# Patient Record
Sex: Male | Born: 1970 | Race: White | Hispanic: No | Marital: Married | Smoking: Former smoker
Health system: Southern US, Community
[De-identification: ages and names within clinical notes are randomized; demographics above are authoritative.]

## PROBLEM LIST (undated history)

## (undated) DIAGNOSIS — F439 Reaction to severe stress, unspecified: Secondary | ICD-10-CM

## (undated) DIAGNOSIS — K219 Gastro-esophageal reflux disease without esophagitis: Secondary | ICD-10-CM

## (undated) DIAGNOSIS — J45909 Unspecified asthma, uncomplicated: Secondary | ICD-10-CM

## (undated) DIAGNOSIS — F419 Anxiety disorder, unspecified: Secondary | ICD-10-CM

## (undated) DIAGNOSIS — E785 Hyperlipidemia, unspecified: Secondary | ICD-10-CM

## (undated) DIAGNOSIS — T781XXA Other adverse food reactions, not elsewhere classified, initial encounter: Secondary | ICD-10-CM

## (undated) HISTORY — PX: NO PAST SURGERIES: SHX2092

## (undated) HISTORY — DX: Hyperlipidemia, unspecified: E78.5

## (undated) HISTORY — DX: Unspecified asthma, uncomplicated: J45.909

## (undated) HISTORY — DX: Reaction to severe stress, unspecified: F43.9

---

## 1999-11-13 ENCOUNTER — Ambulatory Visit (HOSPITAL_COMMUNITY): Admission: RE | Admit: 1999-11-13 | Discharge: 1999-11-13 | Payer: Self-pay | Admitting: Orthopedic Surgery

## 1999-11-13 ENCOUNTER — Encounter: Payer: Self-pay | Admitting: Orthopedic Surgery

## 1999-11-27 ENCOUNTER — Encounter: Payer: Self-pay | Admitting: Orthopedic Surgery

## 1999-11-27 ENCOUNTER — Ambulatory Visit: Admission: RE | Admit: 1999-11-27 | Discharge: 1999-11-27 | Payer: Self-pay | Admitting: Orthopedic Surgery

## 2002-05-11 ENCOUNTER — Encounter: Admission: RE | Admit: 2002-05-11 | Discharge: 2002-05-11 | Payer: Self-pay | Admitting: Family Medicine

## 2002-05-11 ENCOUNTER — Encounter: Payer: Self-pay | Admitting: Family Medicine

## 2003-12-11 ENCOUNTER — Emergency Department (HOSPITAL_COMMUNITY): Admission: EM | Admit: 2003-12-11 | Discharge: 2003-12-11 | Payer: Self-pay | Admitting: Emergency Medicine

## 2004-03-27 ENCOUNTER — Emergency Department (HOSPITAL_COMMUNITY): Admission: EM | Admit: 2004-03-27 | Discharge: 2004-03-27 | Payer: Self-pay | Admitting: Emergency Medicine

## 2004-05-03 ENCOUNTER — Emergency Department (HOSPITAL_COMMUNITY): Admission: EM | Admit: 2004-05-03 | Discharge: 2004-05-04 | Payer: Self-pay | Admitting: Emergency Medicine

## 2008-01-20 ENCOUNTER — Encounter
Admission: RE | Admit: 2008-01-20 | Discharge: 2008-01-20 | Payer: Self-pay | Admitting: Physical Medicine and Rehabilitation

## 2013-03-04 ENCOUNTER — Other Ambulatory Visit: Payer: Self-pay | Admitting: Otolaryngology

## 2013-03-04 DIAGNOSIS — K219 Gastro-esophageal reflux disease without esophagitis: Secondary | ICD-10-CM

## 2013-03-04 DIAGNOSIS — R1310 Dysphagia, unspecified: Secondary | ICD-10-CM

## 2013-03-08 ENCOUNTER — Ambulatory Visit
Admission: RE | Admit: 2013-03-08 | Discharge: 2013-03-08 | Disposition: A | Discharge status: Home / Self Care | Payer: BC Managed Care – PPO | Source: Ambulatory Visit | Attending: Family Medicine | Admitting: Family Medicine

## 2013-03-08 ENCOUNTER — Other Ambulatory Visit: Payer: Self-pay | Admitting: Family Medicine

## 2013-03-08 DIAGNOSIS — R079 Chest pain, unspecified: Secondary | ICD-10-CM

## 2013-03-11 ENCOUNTER — Ambulatory Visit
Admission: RE | Admit: 2013-03-11 | Discharge: 2013-03-11 | Disposition: A | Discharge status: Home / Self Care | Payer: BC Managed Care – PPO | Source: Ambulatory Visit | Attending: Otolaryngology | Admitting: Otolaryngology

## 2013-03-11 DIAGNOSIS — K219 Gastro-esophageal reflux disease without esophagitis: Secondary | ICD-10-CM

## 2013-03-11 DIAGNOSIS — R1310 Dysphagia, unspecified: Secondary | ICD-10-CM

## 2013-03-12 ENCOUNTER — Other Ambulatory Visit: Payer: Self-pay | Admitting: Otolaryngology

## 2015-10-03 ENCOUNTER — Other Ambulatory Visit: Payer: Self-pay | Admitting: Family Medicine

## 2015-10-03 ENCOUNTER — Ambulatory Visit
Admission: RE | Admit: 2015-10-03 | Discharge: 2015-10-03 | Disposition: A | Discharge status: Home / Self Care | Payer: BLUE CROSS/BLUE SHIELD | Source: Ambulatory Visit | Attending: Family Medicine | Admitting: Family Medicine

## 2015-10-03 DIAGNOSIS — T1490XA Injury, unspecified, initial encounter: Secondary | ICD-10-CM

## 2016-01-17 ENCOUNTER — Ambulatory Visit (INDEPENDENT_AMBULATORY_CARE_PROVIDER_SITE_OTHER): Payer: BLUE CROSS/BLUE SHIELD | Admitting: Allergy & Immunology

## 2016-01-17 ENCOUNTER — Encounter (INDEPENDENT_AMBULATORY_CARE_PROVIDER_SITE_OTHER): Payer: Self-pay

## 2016-01-17 ENCOUNTER — Encounter: Payer: Self-pay | Admitting: Allergy & Immunology

## 2016-01-17 VITALS — BP 114/78 | HR 72 | Temp 98.3°F | Ht 73.0 in | Wt 229.3 lb

## 2016-01-17 DIAGNOSIS — J4520 Mild intermittent asthma, uncomplicated: Secondary | ICD-10-CM

## 2016-01-17 DIAGNOSIS — T781XXA Other adverse food reactions, not elsewhere classified, initial encounter: Secondary | ICD-10-CM

## 2016-01-17 DIAGNOSIS — J310 Chronic rhinitis: Secondary | ICD-10-CM | POA: Diagnosis not present

## 2016-01-17 MED ORDER — EPINEPHRINE 0.3 MG/0.3ML IJ SOAJ: INTRAMUSCULAR | 2 refills | Status: AC

## 2016-01-17 NOTE — Addendum Note (Signed)
Addended by: Bennye AlmMIRANDA, Arneda Sappington on: 01/17/2016 03:18 PM   Modules accepted: Orders

## 2016-01-17 NOTE — Progress Notes (Signed)
NEW PATIENT  Date of Service/Encounter:  01/17/16   Assessment:   Adverse food reaction, initial encounter - Plan: Allergy Test, Alpha-Gal Panel, Tryptase  Chronic rhinitis  Mild intermittent asthma, uncomplicated   Asthma Reportables:  Severity: : intermittent  Risk: low Control: well controlled  Seasonal Influenza Vaccine: no but encouraged    Plan/Recommendations:     1. Adverse food reaction, initial encounter - Testing was negative for pork, beef, and lamb. - We will send blood work to confirm. Often the skin testing is negative and the blood testing is positive. - Although he has a past history of allergies to wheat and corn, he is tolerating these without any problems. He was told to avoid these because of cross reactivity with certain pollens, however he never actually had clinical reactions to these foods. - We send in a prescription for AuviQ epinephrine auto injector because there is currently no co-pay.  - The patient's reaction was severe enough to warrant having an epinephrine auto injector.  - Teaching for [place] provided.  2. Chronic rhinitis - Continue with loratadine and eye drops as needed. - Consider doing allergy shots in the future.  - The patient did have allergy shots in the remote past, but there is possibility that they might not have been mixing the injection strong enough.  - The patient is not interested in allergy shots again at this time.  3. Intermittent asthma -  There is no indication for a controller at this time. - Offered script for albuterol but the patient declined.  - Spirometry normal today. There is no need for future spiros.   4. Return to the clinic in six months.         Subjective:   Steven Santos is a 45 y.o. male presenting today for evaluation of  Chief Complaint  Patient presents with  . Allergy Testing    alpha gal testing; hives, diarrhea, vomiting, hands and arms numbness, pain happens at night  onset 10/2015; happened 3-4 times  .  Steven Santos has a history of the following: There are no active problems to display for this patient.   History obtained from: chart review and patient.  Steven Santos was referred by Simona Huh, MD.     Steven Santos is a 45 y.o. male presenting with concern for alpha-gal sensitivity. Steven Santos reports that the symptoms started in May 2017. Around that time, he started developing hives, diarrhea, vomiting, as well as hand and arm numbness. He reports significant abdominal pain. These symptoms are always in the middle of the night. He has missed one entire week of work in July on three occasions. He has even passed out after this in the middle of the night. When he comes to, he will take 2-3 Benadryl since he thinks that this is related to an allergic reaction. He has tick bites multiple times per year. 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 Symptoms are always after red meant (hamburger, steak, BBQ pork). He has been avoiding the red meat and the pork since the middle of July. He has not had any episodes.   Saba does have a history of seasonal allergies. He has been worked up at [facility] in the past (15 years ago). He was told to avoid green beans, corn, and wheat. He has not been avoiding all of those foods. He never had any anaphylactic episodes from this. He did start injections for around one year but did not notice any  improvement in his symptoms, therefore he stopped. This was around 15 years ago. Since that time, he uses loratadine as needed as well as over-the-counter eyedrops. These seem to control his symptoms well.   Asthma/Respiratory Symptom History: Rescue medication: does not have albuterol Controller(s): none Triggers:  cold air, exercise and pollens  Average frequency of daytime symptoms: rare Average frequency of nocturnal symptoms: rare  Average frequency of exercise symptoms: rare Hospitalizations for respiratory symptoms  since last visit (self report): 0 ACT or TRACK score: 25 ED or Urgent Care visits for respiratory symptoms since last visit (self report): 0 Oral/systemic corticosteroids for respiratory symptoms since last visit:0  Allergic Rhinitis Symptom History:  Symptoms: nasal congestion, rhinorrhea, sneezing, eye itching and watery eyes Times of year: summer, fall and spring Treatments tried: oral antihistamines: loratdine 10mg , eye drops:  OTC eye drops Allergy tested in the past: Yes  On IT in the past: Yes (one year)  Otherwise, there is no history of other atopic diseases, including drug allergies, stinging insect allergies, or urticaria. There is no significant infectious history. Vaccinations are up to date.    Past Medical History: There are no active problems to display for this patient.   Medication List:    Medication List       Accurate as of 01/17/16  2:56 PM. Always use your most recent med list.          albuterol 108 (90 Base) MCG/ACT inhaler Commonly known as:  PROVENTIL HFA;VENTOLIN HFA Inhale into the lungs every 6 (six) hours as needed for wheezing or shortness of breath.   atorvastatin 20 MG tablet Commonly known as:  LIPITOR Take 20 mg by mouth daily.   citalopram 40 MG tablet Commonly known as:  CELEXA Take by mouth.   EPINEPHrine 0.3 mg/0.3 mL Soaj injection Commonly known as:  AUVI-Q Use as directed for severe allergic reaction        Birth History: non-contributory  Developmental History: Steven Santos has met all milestones on time.   Past Surgical History: None Past Surgical History:  Procedure Laterality Date  . NO PAST SURGERIES       Family History: Family History  Problem Relation Age of Onset  . Allergic rhinitis Father   . Angioedema Neg Hx   . Asthma Neg Hx   . Eczema Neg Hx   . Immunodeficiency Neg Hx   . Urticaria Neg Hx      Social History: Regis lives at home with his 15yo son. There are two dogs At home. There is no cigarette  exposure. 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 He does spend a lot of time outdoors estimates that he has about a dozen tick bites per year.    Review of Systems: a 14-point review of systems is pertinent for what is mentioned in HPI.  Otherwise, all other systems were negative. Constitutional: negative other than that listed in the HPI Eyes: negative other than that listed in the HPI Ears, nose, mouth, throat, and face: negative other than that listed in the HPI Respiratory: negative other than that listed in the HPI Cardiovascular: negative other than that listed in the HPI Gastrointestinal: negative other than that listed in the HPI Genitourinary: negative other than that listed in the HPI Integument: negative other than that listed in the HPI Hematologic: negative other than that listed in the HPI Musculoskeletal: negative other than that listed in the HPI Neurological: negative other than that listed in the HPI Allergy/Immunologic:   negative other than that listed in the HPI    Objective:   Blood pressure 114/78, pulse 72, temperature 98.3 F (36.8 C), temperature source Oral, height 6' 1" (1.854 m), weight 229 lb 4.5 oz (104 kg). Body mass index is 30.25 kg/m.   Physical Exam:  General: Alert, interactive, in no acute distress. HEENT: TMs pearly gray, turbinates minimally edematous without discharge, post-pharynx mildly erythematous. Neck: Supple without lymphadenopathy. Lungs: Clear to auscultation without wheezing, rhonchi or rales. No crackles. No increased work of breathing.  CV: Normal S1, S2 without murmurs. Abdomen: Nondistended, nontender. Skin:Warm and dry, without lesions or rashes. Extremities: No clubbing, cyanosis or edema. Neuro: Grossly intact. No focal deficits noted  Diagnostic studies:  Spirometry: results normal (FEV1: 3.47/78%, FVC: 4.06/74%, FEV1/FVC: 95%).    Spirometry consistent with normal pattern   Allergy Studies: negative to  pork, beef, and lamb with adequate controls    , MD [facility]     

## 2016-01-17 NOTE — Patient Instructions (Signed)
1. Adverse food reaction, initial encounter - Testing was negative for pork, beef, and lamb. - We will send blood work to confirm. Often the skin testing is negative and the blood testing is positive. - We send in a prescription for AuviQ epinephrine auto injector.  - Keep the AuviQ with you at all times.   2. Chronic rhinitis - Continue with lortatadine and eye drops as needed. - Consider doing allergy shots in the future.  - The previous practice might not have been doing strong enough injections.  3. Return to the clinic in six months.   It was a pleasure to meet you today!

## 2016-01-19 LAB — TRYPTASE: Tryptase: 4.5 ug/L (ref <11)

## 2016-01-22 ENCOUNTER — Telehealth: Payer: Self-pay | Admitting: Allergy & Immunology

## 2016-01-22 LAB — ALPHA-GAL PANEL
ALLERGEN, MUTTON, F88: 2.24 kU/L — AB
ALLERGEN, PORK, F26: 2.64 kU/L — AB
Beef: 4.55 kU/L — ABNORMAL HIGH
Galactose-alpha-1,3-galactose IgE: 11 kU/L — ABNORMAL HIGH (ref <0.35)

## 2016-01-22 NOTE — Telephone Encounter (Signed)
[facility] called about [employer] [employer] and needs the doctor's approval to send a package. [phone]

## 2016-01-22 NOTE — Telephone Encounter (Signed)
Spoke with [facility] and they will mail his package to clinic and we will inform him that it is here once it arrives on Thursday.

## 2016-01-23 ENCOUNTER — Telehealth: Payer: Self-pay | Admitting: Allergy & Immunology

## 2016-01-23 NOTE — Telephone Encounter (Signed)
Patient called and said a nurse called him this morning 01/23/16. Last seen 01/17/16. He thinks it is for lab results.

## 2016-01-24 NOTE — Telephone Encounter (Signed)
I had called pt yesterday about his lab results. I have tried contacting him again this morning.

## 2016-01-24 NOTE — Telephone Encounter (Signed)
Spoke with pt informed him of his results

## 2017-08-11 ENCOUNTER — Other Ambulatory Visit: Payer: Self-pay | Admitting: Family Medicine

## 2017-08-11 ENCOUNTER — Ambulatory Visit
Admission: RE | Admit: 2017-08-11 | Discharge: 2017-08-11 | Disposition: A | Discharge status: Home / Self Care | Payer: BLUE CROSS/BLUE SHIELD | Source: Ambulatory Visit | Attending: Family Medicine | Admitting: Family Medicine

## 2017-08-11 DIAGNOSIS — R0789 Other chest pain: Secondary | ICD-10-CM

## 2017-08-21 ENCOUNTER — Other Ambulatory Visit: Payer: Self-pay

## 2017-08-21 ENCOUNTER — Encounter (HOSPITAL_COMMUNITY): Payer: Self-pay

## 2017-08-21 ENCOUNTER — Emergency Department (HOSPITAL_COMMUNITY)
Admission: EM | Admit: 2017-08-21 | Discharge: 2017-08-21 | Disposition: A | Discharge status: Home / Self Care | Payer: BLUE CROSS/BLUE SHIELD | Attending: Emergency Medicine | Admitting: Emergency Medicine

## 2017-08-21 DIAGNOSIS — Z79899 Other long term (current) drug therapy: Secondary | ICD-10-CM | POA: Diagnosis not present

## 2017-08-21 DIAGNOSIS — R55 Syncope and collapse: Secondary | ICD-10-CM | POA: Diagnosis present

## 2017-08-21 DIAGNOSIS — J45909 Unspecified asthma, uncomplicated: Secondary | ICD-10-CM | POA: Diagnosis not present

## 2017-08-21 DIAGNOSIS — Z87891 Personal history of nicotine dependence: Secondary | ICD-10-CM | POA: Insufficient documentation

## 2017-08-21 LAB — BASIC METABOLIC PANEL
Anion gap: 9 (ref 5–15)
BUN: 18 mg/dL (ref 6–20)
CO2: 25 mmol/L (ref 22–32)
Calcium: 8.8 mg/dL — ABNORMAL LOW (ref 8.9–10.3)
Chloride: 108 mmol/L (ref 101–111)
Creatinine, Ser: 1.21 mg/dL (ref 0.61–1.24)
GFR calc Af Amer: >60 mL/min (ref >60)
GFR calc non Af Amer: >60 mL/min (ref >60)
Glucose, Bld: 92 mg/dL (ref 65–99)
Potassium: 3.6 mmol/L (ref 3.5–5.1)
Sodium: 142 mmol/L (ref 135–145)

## 2017-08-21 LAB — CBC
HEMATOCRIT: 39.7 % (ref 39.0–52.0)
HEMOGLOBIN: 13.5 g/dL (ref 13.0–17.0)
MCH: 32.9 pg (ref 26.0–34.0)
MCHC: 34 g/dL (ref 30.0–36.0)
MCV: 96.8 fL (ref 78.0–100.0)
Platelets: 194 10*3/uL (ref 150–400)
RBC: 4.1 MIL/uL — AB (ref 4.22–5.81)
RDW: 12 % (ref 11.5–15.5)
WBC: 8.5 10*3/uL (ref 4.0–10.5)

## 2017-08-21 LAB — CBG MONITORING, ED: Glucose-Capillary: 99 mg/dL (ref 65–99)

## 2017-08-21 NOTE — ED Notes (Signed)
PT states understanding of care given, follow up care. PT ambulated from ED to car with a steady gait.  

## 2017-08-21 NOTE — ED Triage Notes (Signed)
Pt arrives [facility] from job where pt became weak , sweaty and almost passed out but went to truck and put on ac and was able to maintain alertness. No LOC. Denies chest pain c/o shob . Hand and arms became numb. Given 500cc NS pta.

## 2017-08-21 NOTE — Discharge Instructions (Signed)
Make sure that you drink at least six 8 ounce glasses of water or Gatorade each day in order to stay well-hydrated.  Rest tomorrow and do not do anything strenuous.  Return if your condition worsens for any reason.  Call Dr.Ehinger tomorrow to schedule the next available appointment

## 2017-08-21 NOTE — ED Provider Notes (Signed)
[facility] Provider Note   CSN: [id number] Arrival date & time: 08/21/17  1738     History   Chief Complaint Chief Complaint  Patient presents with  . Near Syncope    HPI Steven Santos is a 47 y.o. male.  Patient brought by EMS.  He reports that at 4 PM today while at work he became lightheaded and sweaty.  For several minutes.  He "both arms became "numb" for a few minutes and then he gradually felt better when he made it back to his truck and turned on the air conditioner.  He is presently asymptomatic except for mild generalized weakness.  No other complaint.  He denies any chest pain denies shortness of breath denies nausea no other associated symptoms.  He states that lightheadedness lasted for 15 or 20 minutes.  EMS treated patient with 500 mL bolus of normal saline prior to arrival.  HPI  Past Medical History:  Diagnosis Date  . Asthma   . Hyperlipidemia   . Stress     There are no active problems to display for this patient.   Past Surgical History:  Procedure Laterality Date  . NO PAST SURGERIES         Home Medications    Prior to Admission medications   Medication Sig Start Date End Date Taking? Authorizing Provider  albuterol (PROVENTIL HFA;VENTOLIN HFA) 108 (90 Base) MCG/ACT inhaler Inhale into the lungs every 6 (six) hours as needed for wheezing or shortness of breath.    [provider]  atorvastatin (LIPITOR) 20 MG tablet Take 20 mg by mouth daily. 01/05/16   [provider]  citalopram (CELEXA) 40 MG tablet Take by mouth.    [provider]  EPINEPHrine (AUVI-Q) 0.3 mg/0.3 mL IJ SOAJ injection Use as directed for severe allergic reaction 01/17/16   Alfonse SpruceGallagher, Joel Louis, MD    Family History Family History  Problem Relation Age of Onset  . Allergic rhinitis Father   . Angioedema Neg Hx   . Asthma Neg Hx   . Eczema Neg Hx   . Immunodeficiency Neg Hx   . Urticaria Neg Hx     History negative for coronary disease Social History Social History   Tobacco Use  . Smoking status: Former Games developermoker  . Smokeless tobacco: Former Engineer, waterUser  Substance Use Topics  . Alcohol use: Yes    Comment: occasional  . Drug use: No     Allergies   Patient has no known allergies.   Review of Systems Review of Systems  Constitutional: Positive for diaphoresis.  HENT: Negative.   Respiratory: Negative.   Cardiovascular: Negative.   Gastrointestinal: Negative.   Musculoskeletal: Negative.   Skin: Negative.   Neurological: Positive for light-headedness and numbness.  Psychiatric/Behavioral: Negative.   All other systems reviewed and are negative.    Physical Exam Updated Vital Signs BP 124/82 (BP Location: Right Arm)   Pulse 71   Temp 98.1 F (36.7 C) (Oral)   Resp 18   Ht 6\' 1"  (1.854 m)   Wt 102.1 kg (225 lb)   SpO2 100%   BMI 29.69 kg/m   Physical Exam  Constitutional: He appears well-developed and well-nourished.  HENT:  Head: Normocephalic and atraumatic.  Eyes: Conjunctivae are normal. Pupils are equal, round, and reactive to light.  Neck: Neck supple. No tracheal deviation present. No thyromegaly present.  Cardiovascular: Normal rate, regular rhythm, normal heart sounds and intact distal pulses. Exam reveals no friction  rub.  No murmur heard. Pulmonary/Chest: Effort normal and breath sounds normal.  Abdominal: Soft. Bowel sounds are normal. He exhibits no distension. There is no tenderness.  Musculoskeletal: Normal range of motion. He exhibits no edema or tenderness.  Neurological: He is alert. Coordination normal.  Skin: Skin is warm and dry. No rash noted.  Psychiatric: He has a normal mood and affect.  Nursing note and vitals reviewed.    ED Treatments / Results  Labs (all labs ordered are listed, but only abnormal results are displayed) Labs Reviewed  BASIC METABOLIC PANEL  CBC  CBG MONITORING, ED    EKG  EKG  Interpretation  Date/Time:  Thursday August 21 2017 17:45:23 EDT Ventricular Rate:  67 PR Interval:    QRS Duration: 97 QT Interval:  396 QTC Calculation: 418 R Axis:   92 Text Interpretation:  Sinus rhythm Borderline right axis deviation Borderline T abnormalities, anterior leads No significant change since last tracing Confirmed by Doug Sou [phone]) on 08/21/2017 5:50:01 PM       Radiology No results found.  Procedures Procedures (including critical care time)  Medications Ordered in ED Medications - No data to display  Results for orders placed or performed during the hospital encounter of 08/21/17  Basic metabolic panel  Result Value Ref Range   Sodium 142 135 - 145 mmol/L   Potassium 3.6 3.5 - 5.1 mmol/L   Chloride 108 101 - 111 mmol/L   CO2 25 22 - 32 mmol/L   Glucose, Bld 92 65 - 99 mg/dL   BUN 18 6 - 20 mg/dL   Creatinine, Ser 6.04 0.61 - 1.24 mg/dL   Calcium 8.8 (L) 8.9 - 10.3 mg/dL   GFR calc non Af Amer >60 >60 mL/min   GFR calc Af Amer >60 >60 mL/min   Anion gap 9 5 - 15  CBC  Result Value Ref Range   WBC 8.5 4.0 - 10.5 K/uL   RBC 4.10 (L) 4.22 - 5.81 MIL/uL   Hemoglobin 13.5 13.0 - 17.0 g/dL   HCT 54.0 98.1 - 19.1 %   MCV 96.8 78.0 - 100.0 fL   MCH 32.9 26.0 - 34.0 pg   MCHC 34.0 30.0 - 36.0 g/dL   RDW 47.8 29.5 - 62.1 %   Platelets 194 150 - 400 K/uL  CBG monitoring, ED  Result Value Ref Range   Glucose-Capillary 99 65 - 99 mg/dL   Comment 1 Notify RN    Comment 2 Document in Chart    Dg Ribs Unilateral W/chest Left  Result Date: 08/11/2017 CLINICAL DATA:  Larey Seat off porch 2 weeks ago injuring the left side with pain EXAM: LEFT RIBS AND CHEST - 3+ VIEW COMPARISON:  Chest x-ray and left rib detail of 03/08/2013 FINDINGS: No active infiltrate or effusion is seen. Mediastinal and hilar contours are unremarkable. The heart is within normal limits in size. Left rib detail films were obtained with a small metallic marker over the area of pain. No left  rib fracture is seen. IMPRESSION: 1. No active lung disease. 2. Negative left rib detail. Electronically Signed   By: Dwyane Dee M.D.   On: 08/11/2017 12:17   Initial Impression / Assessment and Plan / ED Course  I have reviewed the triage vital signs and the nursing notes.  Pertinent labs & imaging results that were available during my care of the patient were reviewed by me and considered in my medical decision making (see chart for details).  7:20 PM patient is alert ambulatory feels well and ready for discharge. Plan follow-up with Dr.Ehinger outpatient Encourage oral hydration Final Clinical Impressions(s) / ED Diagnoses  Diagnoses near syncope  Final diagnoses:  None    ED Discharge Orders    None       Doug Sou, MD 08/21/17 1932

## 2018-06-09 ENCOUNTER — Other Ambulatory Visit: Payer: Self-pay | Admitting: Rheumatology

## 2018-06-09 DIAGNOSIS — M545 Low back pain, unspecified: Secondary | ICD-10-CM

## 2018-06-09 DIAGNOSIS — Z1589 Genetic susceptibility to other disease: Secondary | ICD-10-CM

## 2018-06-16 ENCOUNTER — Other Ambulatory Visit: Payer: BLUE CROSS/BLUE SHIELD

## 2018-06-26 ENCOUNTER — Ambulatory Visit
Admission: RE | Admit: 2018-06-26 | Discharge: 2018-06-26 | Disposition: A | Discharge status: Home / Self Care | Payer: BLUE CROSS/BLUE SHIELD | Source: Ambulatory Visit | Attending: Rheumatology | Admitting: Rheumatology

## 2018-06-26 DIAGNOSIS — M545 Low back pain, unspecified: Secondary | ICD-10-CM

## 2018-06-26 DIAGNOSIS — Z1589 Genetic susceptibility to other disease: Secondary | ICD-10-CM

## 2018-12-04 ENCOUNTER — Ambulatory Visit: Payer: Self-pay | Admitting: Orthopedic Surgery

## 2019-01-08 HISTORY — PX: BACK SURGERY: SHX140

## 2019-04-12 ENCOUNTER — Other Ambulatory Visit: Payer: Self-pay | Admitting: Orthopedic Surgery

## 2019-04-15 ENCOUNTER — Other Ambulatory Visit: Payer: Self-pay | Admitting: Orthopedic Surgery

## 2019-04-15 DIAGNOSIS — M9904 Segmental and somatic dysfunction of sacral region: Secondary | ICD-10-CM

## 2019-04-20 ENCOUNTER — Other Ambulatory Visit: Payer: Self-pay | Admitting: Orthopedic Surgery

## 2019-04-21 ENCOUNTER — Other Ambulatory Visit: Payer: Self-pay | Admitting: Orthopedic Surgery

## 2019-04-23 ENCOUNTER — Ambulatory Visit
Admission: RE | Admit: 2019-04-23 | Discharge: 2019-04-23 | Disposition: A | Discharge status: Home / Self Care | Payer: BC Managed Care – PPO | Source: Ambulatory Visit | Attending: Orthopedic Surgery | Admitting: Orthopedic Surgery

## 2019-04-23 ENCOUNTER — Other Ambulatory Visit: Payer: Self-pay

## 2019-04-23 DIAGNOSIS — M9904 Segmental and somatic dysfunction of sacral region: Secondary | ICD-10-CM

## 2019-05-04 ENCOUNTER — Other Ambulatory Visit: Payer: Self-pay | Admitting: Orthopedic Surgery

## 2019-05-04 DIAGNOSIS — M259 Joint disorder, unspecified: Secondary | ICD-10-CM

## 2019-05-18 ENCOUNTER — Other Ambulatory Visit: Payer: BC Managed Care – PPO

## 2019-05-18 ENCOUNTER — Inpatient Hospital Stay: Admission: RE | Admit: 2019-05-18 | Payer: BC Managed Care – PPO | Source: Ambulatory Visit

## 2019-05-25 ENCOUNTER — Ambulatory Visit
Admission: RE | Admit: 2019-05-25 | Discharge: 2019-05-25 | Disposition: A | Discharge status: Home / Self Care | Payer: BC Managed Care – PPO | Source: Ambulatory Visit | Attending: Orthopedic Surgery | Admitting: Orthopedic Surgery

## 2019-05-25 ENCOUNTER — Other Ambulatory Visit: Payer: Self-pay

## 2019-05-25 DIAGNOSIS — M259 Joint disorder, unspecified: Secondary | ICD-10-CM

## 2019-06-07 ENCOUNTER — Ambulatory Visit: Payer: Self-pay | Admitting: Orthopedic Surgery

## 2019-06-07 NOTE — Pre-Procedure Instructions (Addendum)
Your procedure is scheduled on Wednesday, December 30th, from 1:48 PM to 2:48 PM.  Report to [facility] Main Entrance "A" at 11:45 AM, and check in at the Admitting office.  Call this number if you have problems the morning of surgery:  [phone].  Call [phone] if you have any questions prior to your surgery date Monday-Friday 8am-4pm.    Remember:  Do not eat or drink after midnight the night before your surgery.    Take these medicines the morning of surgery with A SIP OF WATER : gemfibrozil (LOPID)  IF NEEDED: cetirizine (ZYRTEC) albuterol (PROVENTIL HFA;VENTOLIN HFA) inhaler. Please bring all inhalers with you the day of surgery.   As of today, STOP taking any Aspirin (unless otherwise instructed by your surgeon), naproxen sodium (ALEVE), Ibuprofen, Motrin, Advil, Goody's, BC's, all herbal medications, fish oil, and all vitamins.    The Morning of Surgery  Do not wear jewelry.  Do not wear lotions, powders, colognes, or deodorant  Men may shave face and neck.  Do not bring valuables to the hospital.  [facility] is not responsible for any belongings or valuables.  If you are a smoker, DO NOT Smoke 24 hours prior to surgery  If you wear a CPAP at night please bring your mask, tubing, and machine the morning of surgery   Remember that you must have someone to transport you home after your surgery, and remain with you for 24 hours if you are discharged the same day.   Please bring cases for contacts, glasses, hearing aids, dentures or bridgework because it cannot be worn into surgery.    Leave your suitcase in the car.  After surgery it may be brought to your room.  For patients admitted to the hospital, discharge time will be determined by your treatment team.  Patients discharged the day of surgery will not be allowed to drive home.    Special instructions:   [place]- Preparing For Surgery  Before surgery, you can play an important role. Because  skin is not sterile, your skin needs to be as free of germs as possible. You can reduce the number of germs on your skin by washing with CHG (chlorahexidine gluconate) Soap before surgery.  CHG is an antiseptic cleaner which kills germs and bonds with the skin to continue killing germs even after washing.    Please do not use if you have an allergy to CHG or antibacterial soaps. If your skin becomes reddened/irritated stop using the CHG.  Do not shave (including legs and underarms) for at least 48 hours prior to first CHG shower. It is OK to shave your face.  Please follow these instructions carefully.   1. Shower the NIGHT BEFORE SURGERY and the MORNING OF SURGERY with CHG Soap.   2. If you chose to wash your hair, wash your hair first as usual with your normal shampoo.  3. After you shampoo, rinse your hair and body thoroughly to remove the shampoo.  4. Use CHG as you would any other liquid soap. You can apply CHG directly to the skin and wash gently with a scrungie or a clean washcloth.   5. Apply the CHG Soap to your body ONLY FROM THE NECK DOWN.  Do not use on open wounds or open sores. Avoid contact with your eyes, ears, mouth and genitals (private parts). Wash Face and genitals (private parts)  with your normal soap.   6. Wash thoroughly, paying special attention to the area where  your surgery will be performed.  7. Thoroughly rinse your body with warm water from the neck down.  8. DO NOT shower/wash with your normal soap after using and rinsing off the CHG Soap.  9. Pat yourself dry with a CLEAN TOWEL.  10. Wear CLEAN PAJAMAS to bed the night before surgery, wear comfortable clothes the morning of surgery  11. Place CLEAN SHEETS on your bed the night of your first shower and DO NOT SLEEP WITH PETS.    Day of Surgery:   Remember to brush your teeth WITH YOUR REGULAR TOOTHPASTE. Please shower the morning of surgery with the CHG soap Do not apply any  deodorants/lotions. Please wear clean clothes to the hospital/surgery center.      Please read over the following fact sheets that you were given.

## 2019-06-08 ENCOUNTER — Encounter (HOSPITAL_COMMUNITY): Payer: Self-pay

## 2019-06-08 ENCOUNTER — Other Ambulatory Visit: Payer: Self-pay

## 2019-06-08 ENCOUNTER — Ambulatory Visit (HOSPITAL_COMMUNITY)
Admission: RE | Admit: 2019-06-08 | Discharge: 2019-06-08 | Disposition: A | Discharge status: Home / Self Care | Payer: BC Managed Care – PPO | Source: Ambulatory Visit | Attending: Orthopedic Surgery | Admitting: Orthopedic Surgery

## 2019-06-08 ENCOUNTER — Other Ambulatory Visit (HOSPITAL_COMMUNITY)
Admission: RE | Admit: 2019-06-08 | Discharge: 2019-06-08 | Disposition: A | Discharge status: Home / Self Care | Payer: BC Managed Care – PPO | Source: Ambulatory Visit | Attending: Orthopedic Surgery | Admitting: Orthopedic Surgery

## 2019-06-08 ENCOUNTER — Ambulatory Visit: Payer: Self-pay | Admitting: Orthopedic Surgery

## 2019-06-08 ENCOUNTER — Encounter (HOSPITAL_COMMUNITY)
Admission: RE | Admit: 2019-06-08 | Discharge: 2019-06-08 | Disposition: A | Discharge status: Home / Self Care | Payer: BC Managed Care – PPO | Source: Ambulatory Visit | Attending: Orthopedic Surgery | Admitting: Orthopedic Surgery

## 2019-06-08 DIAGNOSIS — Z01818 Encounter for other preprocedural examination: Secondary | ICD-10-CM | POA: Insufficient documentation

## 2019-06-08 HISTORY — DX: Gastro-esophageal reflux disease without esophagitis: K21.9

## 2019-06-08 HISTORY — DX: Anxiety disorder, unspecified: F41.9

## 2019-06-08 LAB — BASIC METABOLIC PANEL
Anion gap: 13 (ref 5–15)
BUN: 20 mg/dL (ref 6–20)
CO2: 25 mmol/L (ref 22–32)
Calcium: 9.7 mg/dL (ref 8.9–10.3)
Chloride: 104 mmol/L (ref 98–111)
Creatinine, Ser: 1.21 mg/dL (ref 0.61–1.24)
GFR calc Af Amer: >60 mL/min (ref >60)
GFR calc non Af Amer: >60 mL/min (ref >60)
Glucose, Bld: 106 mg/dL — ABNORMAL HIGH (ref 70–99)
Potassium: 4.1 mmol/L (ref 3.5–5.1)
Sodium: 142 mmol/L (ref 135–145)

## 2019-06-08 LAB — URINALYSIS, ROUTINE W REFLEX MICROSCOPIC
Bilirubin Urine: NEGATIVE
Glucose, UA: NEGATIVE mg/dL
Hgb urine dipstick: NEGATIVE
Ketones, ur: NEGATIVE mg/dL
Leukocytes,Ua: NEGATIVE
Nitrite: NEGATIVE
Protein, ur: NEGATIVE mg/dL
Specific Gravity, Urine: 1.026 (ref 1.005–1.030)
pH: 5 (ref 5.0–8.0)

## 2019-06-08 LAB — APTT: aPTT: 25 seconds (ref 24–36)

## 2019-06-08 LAB — PROTIME-INR
INR: 0.9 (ref 0.8–1.2)
Prothrombin Time: 12.4 seconds (ref 11.4–15.2)

## 2019-06-08 LAB — CBC
HCT: 45.1 % (ref 39.0–52.0)
Hemoglobin: 15.3 g/dL (ref 13.0–17.0)
MCH: 33.1 pg (ref 26.0–34.0)
MCHC: 33.9 g/dL (ref 30.0–36.0)
MCV: 97.6 fL (ref 80.0–100.0)
Platelets: 251 10*3/uL (ref 150–400)
RBC: 4.62 MIL/uL (ref 4.22–5.81)
RDW: 11.7 % (ref 11.5–15.5)
WBC: 6.8 10*3/uL (ref 4.0–10.5)
nRBC: 0 % (ref 0.0–0.2)

## 2019-06-08 LAB — SURGICAL PCR SCREEN
MRSA, PCR: NEGATIVE
Staphylococcus aureus: POSITIVE — AB

## 2019-06-08 LAB — SARS CORONAVIRUS 2 (TAT 6-24 HRS): SARS Coronavirus 2: NEGATIVE

## 2019-06-08 NOTE — H&P (Signed)
Subjective:   The patient is here today for follow-up of The patient is 5.75 months out from their surgery.; spo right SI JT fusion on 01/08/19 The patient is having pain level 4/10 The patient has been using over the counter medication (Aleve 4-5/day) CT guided right SI joint injection 05-25-19- helped for 1 day  Past Medical History:  Diagnosis Date  . Asthma   . Hyperlipidemia   . Stress     Past Surgical History:  Procedure Laterality Date  . NO PAST SURGERIES      Current Outpatient Medications  Medication Sig Dispense Refill Last Dose  . albuterol (PROVENTIL HFA;VENTOLIN HFA) 108 (90 Base) MCG/ACT inhaler Inhale 2 puffs into the lungs every 6 (six) hours as needed for wheezing or shortness of breath.      . cetirizine (ZYRTEC) 10 MG tablet Take 10 mg by mouth daily as needed for allergies.      . citalopram (CELEXA) 40 MG tablet Take 40 mg by mouth at bedtime.      Marland Kitchen EPINEPHrine (AUVI-Q) 0.3 mg/0.3 mL IJ SOAJ injection Use as directed for severe allergic reaction (Patient taking differently: Inject 0.3 mg into the muscle once as needed (for a severe allergic reaction). ) 2 Device 2   . gemfibrozil (LOPID) 600 MG tablet Take 600 mg by mouth 2 (two) times daily.     . naproxen sodium (ALEVE) 220 MG tablet Take 220 mg by mouth 5 (five) times daily as needed (pain).      No current facility-administered medications for this visit.   No Known Allergies  Social History   Tobacco Use  . Smoking status: Former Research scientist (life sciences)  . Smokeless tobacco: Former Network engineer Use Topics  . Alcohol use: Yes    Comment: occasional    Family History  Problem Relation Age of Onset  . Allergic rhinitis Father   . Angioedema Neg Hx   . Asthma Neg Hx   . Eczema Neg Hx   . Immunodeficiency Neg Hx   . Urticaria Neg Hx     Review of Systems As stated in HPI  Objective:    Clinical exam: Steven Santos returns today for follow-up of his CT-guided injection of the lower right SI screw.  CT scan  demonstrates positioning of the needle the base of the prominent right lower SI screw.  Patient reports significant temporary improvement (greater than 90%) in his pain.  No other significant change to his clinical exam.   Assessment:   Steven Santos returns today for follow-up of his CT-guided injection of the right lower SI fusion screw. He states he had 90% improvement temporarily following the Marcaine injection. The CT-guided injection definitively showed injection in and around that lower SI screw which is prominent. I do believe now based on the diagnostic injectiom that his primary source of pain the prominent lower SI screw. At this point he is expressed a desire to move forward with a revision of the screw. We have discussed both removal of the screw and repositioning of the screw. At this point my preference is to attempt to reposition it. Based on the CT scan I do believe that I can remove the screw and advanced it another centimeter without compromising the foramen. This would remove the gluteal irritation which it is currently causing. I hope would be to reproduce the results of the recent diagnostic injection.  Plan:   I have gone over the surgery in great detail with the patient including the risks and benefits.  They include infection, bleeding, nerve damage, fracture of the pelvis, ongoing or worse pain, need for additional surgery. Risks of anesthesia also include death, stroke, paralysis.  Postoperatively if we were to advance the screw I have recommended he remain out of work for 6 weeks but I do not think he would need to be crutches nonweightbearing. If we are unable to advance the screw then I would remove the screw.  Treatment plan: Revision of the right SI fusion lower screw.

## 2019-06-08 NOTE — Progress Notes (Signed)
PCP - Gaynelle Arabian, MD Cardiologist - Denies  PPM/ICD - Denies  Chest x-ray - 06/08/2019 EKG - N/A Stress Test - Denies ECHO - Denies Cardiac Cath - Denies  Sleep Study - Denies  Patient denies being a diabetic  Blood Thinner Instructions: N/A Aspirin Instructions: N/A  ERAS Protcol - None  COVID TEST- 06/08/2019   Anesthesia review: Yes, per pre-op order.  Patient denies shortness of breath, fever, cough and chest pain at PAT appointment  Coronavirus Screening  Have you experienced the following symptoms:  Cough yes/no: No Fever (>100.57F)  yes/no: No Runny nose yes/no: No Sore throat yes/no: No Difficulty breathing/shortness of breath  yes/no: No  Have you or a family member traveled in the last 14 days and where? No   If the patient indicates "YES" to the above questions, their PAT will be rescheduled to limit the exposure to others and, the surgeon will be notified. THE PATIENT WILL NEED TO BE ASYMPTOMATIC FOR 14 DAYS.   If the patient is not experiencing any of these symptoms, the PAT nurse will instruct them to NOT bring anyone with them to their appointment since they may have these symptoms or traveled as well.   Please remind your patients and families that hospital visitation restrictions are in effect and the importance of the restrictions.    All instructions explained to the patient, with a verbal understanding of the material. Patient agrees to go over the instructions while at home for a better understanding. Patient also instructed to self quarantine after being tested for COVID-19. The opportunity to ask questions was provided.

## 2019-06-09 ENCOUNTER — Other Ambulatory Visit: Payer: Self-pay

## 2019-06-09 ENCOUNTER — Encounter (HOSPITAL_COMMUNITY): Payer: Self-pay | Admitting: Orthopedic Surgery

## 2019-06-09 ENCOUNTER — Ambulatory Visit (HOSPITAL_COMMUNITY)
Admission: RE | Admit: 2019-06-09 | Discharge: 2019-06-09 | Disposition: A | Discharge status: Home / Self Care | Payer: BC Managed Care – PPO | Attending: Orthopedic Surgery | Admitting: Orthopedic Surgery

## 2019-06-09 ENCOUNTER — Ambulatory Visit (HOSPITAL_COMMUNITY): Payer: BC Managed Care – PPO | Admitting: Anesthesiology

## 2019-06-09 ENCOUNTER — Ambulatory Visit (HOSPITAL_COMMUNITY): Payer: BC Managed Care – PPO

## 2019-06-09 ENCOUNTER — Encounter (HOSPITAL_COMMUNITY)
Admission: RE | Disposition: A | Discharge status: Home / Self Care | Payer: Self-pay | Source: Home / Self Care | Attending: Orthopedic Surgery

## 2019-06-09 DIAGNOSIS — E785 Hyperlipidemia, unspecified: Secondary | ICD-10-CM | POA: Insufficient documentation

## 2019-06-09 DIAGNOSIS — K219 Gastro-esophageal reflux disease without esophagitis: Secondary | ICD-10-CM | POA: Diagnosis not present

## 2019-06-09 DIAGNOSIS — Z87891 Personal history of nicotine dependence: Secondary | ICD-10-CM | POA: Insufficient documentation

## 2019-06-09 DIAGNOSIS — T8484XA Pain due to internal orthopedic prosthetic devices, implants and grafts, initial encounter: Secondary | ICD-10-CM | POA: Diagnosis not present

## 2019-06-09 DIAGNOSIS — Z79899 Other long term (current) drug therapy: Secondary | ICD-10-CM | POA: Diagnosis not present

## 2019-06-09 DIAGNOSIS — Z981 Arthrodesis status: Secondary | ICD-10-CM | POA: Diagnosis not present

## 2019-06-09 DIAGNOSIS — J45909 Unspecified asthma, uncomplicated: Secondary | ICD-10-CM | POA: Insufficient documentation

## 2019-06-09 DIAGNOSIS — F419 Anxiety disorder, unspecified: Secondary | ICD-10-CM | POA: Diagnosis not present

## 2019-06-09 DIAGNOSIS — X58XXXA Exposure to other specified factors, initial encounter: Secondary | ICD-10-CM | POA: Insufficient documentation

## 2019-06-09 DIAGNOSIS — Z419 Encounter for procedure for purposes other than remedying health state, unspecified: Secondary | ICD-10-CM

## 2019-06-09 DIAGNOSIS — Z4589 Encounter for adjustment and management of other implanted devices: Secondary | ICD-10-CM | POA: Diagnosis present

## 2019-06-09 HISTORY — PX: SACROILIAC JOINT FUSION: SHX6088

## 2019-06-09 SURGERY — SACROILIAC JOINT FUSION
Anesthesia: General | Site: Back | Laterality: Right

## 2019-06-09 MED ORDER — OXYCODONE-ACETAMINOPHEN 10-325 MG PO TABS: 1.0000 | ORAL_TABLET | Freq: Four times a day (QID) | ORAL | 0 refills | Status: AC | PRN

## 2019-06-09 MED ORDER — SUGAMMADEX SODIUM 200 MG/2ML IV SOLN
INTRAVENOUS | Status: DC | PRN
  Administered 2019-06-09: 225 mg via INTRAVENOUS

## 2019-06-09 MED ORDER — PROPOFOL 10 MG/ML IV BOLUS
INTRAVENOUS | Status: DC | PRN
  Administered 2019-06-09: 200 mg via INTRAVENOUS

## 2019-06-09 MED ORDER — BUPIVACAINE LIPOSOME 1.3 % IJ SUSP
20.0000 mL | Freq: Once | INTRAMUSCULAR | Status: DC
  Filled 2019-06-09: qty 20

## 2019-06-09 MED ORDER — MIDAZOLAM HCL 5 MG/5ML IJ SOLN
INTRAMUSCULAR | Status: DC | PRN
  Administered 2019-06-09: 2 mg via INTRAVENOUS

## 2019-06-09 MED ORDER — CEFAZOLIN SODIUM-DEXTROSE 2-4 GM/100ML-% IV SOLN
2.0000 g | INTRAVENOUS | Status: AC
  Administered 2019-06-09: 14:00:00 2 g via INTRAVENOUS
  Filled 2019-06-09: qty 100

## 2019-06-09 MED ORDER — FENTANYL CITRATE (PF) 100 MCG/2ML IJ SOLN: 25.0000 ug | INTRAMUSCULAR | Status: DC | PRN

## 2019-06-09 MED ORDER — ONDANSETRON HCL 4 MG/2ML IJ SOLN: 4.0000 mg | Freq: Four times a day (QID) | INTRAMUSCULAR | Status: DC | PRN

## 2019-06-09 MED ORDER — ONDANSETRON HCL 4 MG PO TABS: 4.0000 mg | ORAL_TABLET | Freq: Four times a day (QID) | ORAL | Status: DC | PRN

## 2019-06-09 MED ORDER — FENTANYL CITRATE (PF) 250 MCG/5ML IJ SOLN
INTRAMUSCULAR | Status: AC
  Filled 2019-06-09: qty 5

## 2019-06-09 MED ORDER — BUPIVACAINE-EPINEPHRINE (PF) 0.25% -1:200000 IJ SOLN
INTRAMUSCULAR | Status: AC
  Filled 2019-06-09: qty 30

## 2019-06-09 MED ORDER — PROPOFOL 10 MG/ML IV BOLUS
INTRAVENOUS | Status: AC
  Filled 2019-06-09: qty 20

## 2019-06-09 MED ORDER — ONDANSETRON HCL 4 MG/2ML IJ SOLN
INTRAMUSCULAR | Status: DC | PRN
  Administered 2019-06-09: 4 mg via INTRAVENOUS

## 2019-06-09 MED ORDER — CEFAZOLIN SODIUM-DEXTROSE 1-4 GM/50ML-% IV SOLN: 1.0000 g | Freq: Three times a day (TID) | INTRAVENOUS | Status: DC

## 2019-06-09 MED ORDER — SUCCINYLCHOLINE CHLORIDE 20 MG/ML IJ SOLN
INTRAMUSCULAR | Status: DC | PRN
  Administered 2019-06-09: 140 mg via INTRAVENOUS

## 2019-06-09 MED ORDER — PHENOL 1.4 % MT LIQD: 1.0000 | OROMUCOSAL | Status: DC | PRN

## 2019-06-09 MED ORDER — LIDOCAINE 2% (20 MG/ML) 5 ML SYRINGE
INTRAMUSCULAR | Status: AC
  Filled 2019-06-09: qty 5

## 2019-06-09 MED ORDER — PROMETHAZINE HCL 25 MG/ML IJ SOLN: 6.2500 mg | INTRAMUSCULAR | Status: DC | PRN

## 2019-06-09 MED ORDER — ACETAMINOPHEN 325 MG PO TABS: 650.0000 mg | ORAL_TABLET | ORAL | Status: DC | PRN

## 2019-06-09 MED ORDER — 0.9 % SODIUM CHLORIDE (POUR BTL) OPTIME
TOPICAL | Status: DC | PRN
  Administered 2019-06-09: 1000 mL

## 2019-06-09 MED ORDER — LACTATED RINGERS IV SOLN: INTRAVENOUS | Status: DC

## 2019-06-09 MED ORDER — EPHEDRINE 5 MG/ML INJ
INTRAVENOUS | Status: AC
  Filled 2019-06-09: qty 10

## 2019-06-09 MED ORDER — BUPIVACAINE-EPINEPHRINE 0.25% -1:200000 IJ SOLN
INTRAMUSCULAR | Status: DC | PRN
  Administered 2019-06-09: 20 mL
  Administered 2019-06-09: 10 mL

## 2019-06-09 MED ORDER — OXYCODONE HCL 5 MG PO TABS: 5.0000 mg | ORAL_TABLET | Freq: Once | ORAL | Status: DC | PRN

## 2019-06-09 MED ORDER — ONDANSETRON HCL 4 MG PO TABS: 4.0000 mg | ORAL_TABLET | Freq: Three times a day (TID) | ORAL | 0 refills | Status: DC | PRN

## 2019-06-09 MED ORDER — SODIUM CHLORIDE 0.9% FLUSH: 3.0000 mL | INTRAVENOUS | Status: DC | PRN

## 2019-06-09 MED ORDER — ACETAMINOPHEN 500 MG PO TABS
1000.0000 mg | ORAL_TABLET | Freq: Once | ORAL | Status: AC
  Administered 2019-06-09: 1000 mg via ORAL
  Filled 2019-06-09: qty 2

## 2019-06-09 MED ORDER — SUCCINYLCHOLINE CHLORIDE 200 MG/10ML IV SOSY
PREFILLED_SYRINGE | INTRAVENOUS | Status: AC
  Filled 2019-06-09: qty 10

## 2019-06-09 MED ORDER — DEXAMETHASONE SODIUM PHOSPHATE 4 MG/ML IJ SOLN
INTRAMUSCULAR | Status: DC | PRN
  Administered 2019-06-09: 4 mg via INTRAVENOUS

## 2019-06-09 MED ORDER — MIDAZOLAM HCL 2 MG/2ML IJ SOLN
INTRAMUSCULAR | Status: AC
  Filled 2019-06-09: qty 2

## 2019-06-09 MED ORDER — OXYCODONE HCL 5 MG PO TABS: 10.0000 mg | ORAL_TABLET | ORAL | Status: DC | PRN

## 2019-06-09 MED ORDER — OXYCODONE HCL 5 MG/5ML PO SOLN: 5.0000 mg | Freq: Once | ORAL | Status: DC | PRN

## 2019-06-09 MED ORDER — ROCURONIUM BROMIDE 10 MG/ML (PF) SYRINGE
PREFILLED_SYRINGE | INTRAVENOUS | Status: DC | PRN
  Administered 2019-06-09: 10 mg via INTRAVENOUS
  Administered 2019-06-09: 50 mg via INTRAVENOUS

## 2019-06-09 MED ORDER — METHOCARBAMOL 500 MG PO TABS: 500.0000 mg | ORAL_TABLET | Freq: Four times a day (QID) | ORAL | Status: DC | PRN

## 2019-06-09 MED ORDER — METHOCARBAMOL 500 MG PO TABS: 500.0000 mg | ORAL_TABLET | Freq: Three times a day (TID) | ORAL | 0 refills | Status: AC | PRN

## 2019-06-09 MED ORDER — EPHEDRINE SULFATE-NACL 50-0.9 MG/10ML-% IV SOSY
PREFILLED_SYRINGE | INTRAVENOUS | Status: DC | PRN
  Administered 2019-06-09 (×2): 10 mg via INTRAVENOUS

## 2019-06-09 MED ORDER — SODIUM CHLORIDE 0.9% FLUSH: 3.0000 mL | Freq: Two times a day (BID) | INTRAVENOUS | Status: DC

## 2019-06-09 MED ORDER — ROCURONIUM BROMIDE 10 MG/ML (PF) SYRINGE
PREFILLED_SYRINGE | INTRAVENOUS | Status: AC
  Filled 2019-06-09: qty 10

## 2019-06-09 MED ORDER — ONDANSETRON HCL 4 MG/2ML IJ SOLN
INTRAMUSCULAR | Status: AC
  Filled 2019-06-09: qty 2

## 2019-06-09 MED ORDER — LIDOCAINE 2% (20 MG/ML) 5 ML SYRINGE
INTRAMUSCULAR | Status: DC | PRN
  Administered 2019-06-09: 100 mg via INTRAVENOUS

## 2019-06-09 MED ORDER — DEXAMETHASONE SODIUM PHOSPHATE 10 MG/ML IJ SOLN
INTRAMUSCULAR | Status: AC
  Filled 2019-06-09: qty 1

## 2019-06-09 MED ORDER — OXYCODONE HCL 5 MG PO TABS: 5.0000 mg | ORAL_TABLET | ORAL | Status: DC | PRN

## 2019-06-09 MED ORDER — ACETAMINOPHEN 650 MG RE SUPP: 650.0000 mg | RECTAL | Status: DC | PRN

## 2019-06-09 MED ORDER — BUPIVACAINE LIPOSOME 1.3 % IJ SUSP
INTRAMUSCULAR | Status: DC | PRN
  Administered 2019-06-09: 20 mL

## 2019-06-09 MED ORDER — METHOCARBAMOL 1000 MG/10ML IJ SOLN
500.0000 mg | Freq: Four times a day (QID) | INTRAVENOUS | Status: DC | PRN
  Filled 2019-06-09: qty 5

## 2019-06-09 MED ORDER — FENTANYL CITRATE (PF) 100 MCG/2ML IJ SOLN
INTRAMUSCULAR | Status: DC | PRN
  Administered 2019-06-09 (×2): 50 ug via INTRAVENOUS

## 2019-06-09 MED ORDER — SODIUM CHLORIDE 0.9 % IV SOLN: 250.0000 mL | INTRAVENOUS | Status: DC

## 2019-06-09 MED ORDER — MENTHOL 3 MG MT LOZG: 1.0000 | LOZENGE | OROMUCOSAL | Status: DC | PRN

## 2019-06-09 SURGICAL SUPPLY — 48 items
BLADE SURG 11 STRL SS (BLADE) ×3 IMPLANT
CLOSURE STERI-STRIP 1/2X4 (GAUZE/BANDAGES/DRESSINGS) ×1
CLSR STERI-STRIP ANTIMIC 1/2X4 (GAUZE/BANDAGES/DRESSINGS) ×2 IMPLANT
COVER SURGICAL LIGHT HANDLE (MISCELLANEOUS) ×3 IMPLANT
COVER WAND RF STERILE (DRAPES) ×3 IMPLANT
DRAPE C-ARM 42X72 X-RAY (DRAPES) ×3 IMPLANT
DRAPE C-ARMOR (DRAPES) ×3 IMPLANT
DRAPE SURG 17X23 STRL (DRAPES) ×3 IMPLANT
DRAPE U-SHAPE 47X51 STRL (DRAPES) ×3 IMPLANT
DRSG OPSITE POSTOP 4X6 (GAUZE/BANDAGES/DRESSINGS) ×6 IMPLANT
DURAPREP 26ML APPLICATOR (WOUND CARE) ×3 IMPLANT
ELECT BLADE 4.0 EZ CLEAN MEGAD (MISCELLANEOUS) ×3
ELECT PENCIL ROCKER SW 15FT (MISCELLANEOUS) ×3 IMPLANT
ELECT REM PT RETURN 9FT ADLT (ELECTROSURGICAL) ×3
ELECTRODE BLDE 4.0 EZ CLN MEGD (MISCELLANEOUS) ×1 IMPLANT
ELECTRODE REM PT RTRN 9FT ADLT (ELECTROSURGICAL) ×1 IMPLANT
GLOVE BIOGEL PI IND STRL 8.5 (GLOVE) ×1 IMPLANT
GLOVE BIOGEL PI INDICATOR 8.5 (GLOVE) ×2
GLOVE SS BIOGEL STRL SZ 8.5 (GLOVE) ×1 IMPLANT
GLOVE SUPERSENSE BIOGEL SZ 8.5 (GLOVE) ×2
GOWN STRL REUS W/ TWL LRG LVL3 (GOWN DISPOSABLE) ×1 IMPLANT
GOWN STRL REUS W/TWL 2XL LVL3 (GOWN DISPOSABLE) ×3 IMPLANT
GOWN STRL REUS W/TWL LRG LVL3 (GOWN DISPOSABLE) ×2
GUIDE PIN IFUSE DISP 3.2 (PIN) ×3
IMPL IFUSE 3D 7X35 (Rod) ×1 IMPLANT
IMPLANT IFUSE 3D 7X35 (Rod) ×3 IMPLANT
KIT BASIN OR (CUSTOM PROCEDURE TRAY) ×3 IMPLANT
KIT TURNOVER KIT B (KITS) ×3 IMPLANT
NEEDLE 22X1 1/2 (OR ONLY) (NEEDLE) ×3 IMPLANT
NS IRRIG 1000ML POUR BTL (IV SOLUTION) ×3 IMPLANT
PACK LAMINECTOMY ORTHO (CUSTOM PROCEDURE TRAY) ×3 IMPLANT
PACK UNIVERSAL I (CUSTOM PROCEDURE TRAY) ×3 IMPLANT
PAD ARMBOARD 7.5X6 YLW CONV (MISCELLANEOUS) ×6 IMPLANT
PIN BLUNT IFUSE DISP 3.2 (PIN) ×3 IMPLANT
PIN EXCHANGE IFUSE DISP 3.2 (PIN) ×3 IMPLANT
PIN GUIDE IFUSE DISP 3.2 (PIN) ×1 IMPLANT
POSITIONER HEAD PRONE TRACH (MISCELLANEOUS) ×3 IMPLANT
STAPLER VISISTAT 35W (STAPLE) ×3 IMPLANT
SUT MNCRL AB 3-0 PS2 18 (SUTURE) IMPLANT
SUT VIC AB 1 CT1 18XCR BRD 8 (SUTURE) ×1 IMPLANT
SUT VIC AB 1 CT1 8-18 (SUTURE) ×2
SUT VIC AB 2-0 CT1 18 (SUTURE) ×3 IMPLANT
SYR BULB IRRIGATION 50ML (SYRINGE) ×3 IMPLANT
SYR CONTROL 10ML LL (SYRINGE) ×3 IMPLANT
TOWEL GREEN STERILE (TOWEL DISPOSABLE) ×3 IMPLANT
TOWEL GREEN STERILE FF (TOWEL DISPOSABLE) ×3 IMPLANT
WATER STERILE IRR 1000ML POUR (IV SOLUTION) ×3 IMPLANT
YANKAUER SUCT BULB TIP NO VENT (SUCTIONS) ×3 IMPLANT

## 2019-06-09 NOTE — Discharge Instructions (Signed)
SI Fusion, Care After °This sheet gives you information about how to care for yourself after your procedure. Your health care provider may also give you more specific instructions. If you have problems or questions, contact your health care provider. °What can I expect after the procedure? °After the procedure, it is common to have: °· Some pain around your incision area. °· Muscle tightening (spasms) across the back. °  °Follow these instructions at home: °Incision care °· Follow instructions from your health care provider about how to take care of your incision area. Make sure you: °? Wash your hands with soap and water before and after you apply medicine to the area or change your bandage (dressing). If soap and water are not available, use hand sanitizer. °? Change your dressing as told by your health care provider. °? Leave stitches (sutures), skin glue, or adhesive strips in place. These skin closures may need to stay in place for 2 weeks or longer. If adhesive strip edges start to loosen and curl up, you may trim the loose edges. Do not remove adhesive strips completely unless your health care provider tells you to do that. °·  °· Check your incision area every day for signs of infection. Check for: °? More redness, swelling, or pain. °? More fluid or blood. °? Warmth. °? Pus or a bad smell. °Medicines °· Take over-the-counter and prescription medicines only as told by your health care provider. °· If you were prescribed an antibiotic medicine, use it as told by your health care provider. Do not stop using the antibiotic even if you start to feel better. °· Pain medications have been electronically prescribed. °Bathing °· Do not take baths, swim, or use a hot tub for 6 weeks, or until your incision has healed completely. °· If your health care provider approves, you may take showers after your dressing has been removed. °· Ok to shower in five (5) days °Activity °· Return to your normal activities as told by  your health care provider. Ask your health care provider what activities are safe for you. °· Avoid bending or twisting at your waist. Always bend at your knees. °· Do not sit for more than 20-30 minutes at a time. Lie down or walk between periods of sitting. °· Do not lift anything that is heavier than 10 lb (4.5 kg) or the limit that your health care provider tells you, until he or she says that it is safe. °· Do not drive for 2 weeks after your procedure or for as long as your health care provider tells you. °·  °· Do not drive or use heavy machinery while taking prescription pain medicine. °· DO NOT BEAR WEIGHT ON THE LEFT LEG °General instructions °· To prevent or treat constipation while you are taking prescription pain medicine, your health care provider may recommend that you: °? Drink enough fluid to keep your urine clear or pale yellow. °? Take over-the-counter or prescription medicines. °? Eat foods that are high in fiber, such as fresh fruits and vegetables, whole grains, and beans. °? Limit foods that are high in fat and processed sugars, such as fried and sweet foods. °· Do breathing exercises as told. °· Keep all follow-up visits as told by your health care provider. This is important. °Contact a health care provider if: °· You have more redness, swelling, or pain around your incision area. °· Your incision feels warm to the touch. °· You are not able to return to activities or   do exercises as told by your health care provider. °Get help right away if: °· You have: °? More fluid or blood coming from your incision area. °? Pus or a bad smell coming from your incision area. °? Chills or a fever. °? Episodes of dizziness or fainting while standing. °· You develop a rash. °· You develop shortness of breath or you have difficulty breathing. °· You cannot control when you urinate or have a bowel movement. °· You become weak. °· You are not able to use your legs. °Summary °· After the procedure, it is common  to have some pain around your incision area. You may also have muscle tightening (spasms) across the back. °· Follow instructions from your health care provider about how to care for your incision. °· Do not lift anything that is heavier than 10 lb (4.5 kg) or the limit that your health care provider tells you, until he or she says that it is safe. °· Contact your health care provider if you have more redness, swelling, or pain around your incision area or if your incision feels warm to the touch. These can be signs of infection. °This information is not intended to replace advice given to you by your health care provider. Make sure you discuss any questions you have with your health care provider. ° °

## 2019-06-09 NOTE — Anesthesia Procedure Notes (Signed)
Procedure Name: Intubation Date/Time: 06/09/2019 1:26 PM Performed by: Jenne Campus, CRNA Pre-anesthesia Checklist: Patient identified, Emergency Drugs available, Suction available and Patient being monitored Patient Re-evaluated:Patient Re-evaluated prior to induction Oxygen Delivery Method: Circle System Utilized Preoxygenation: Pre-oxygenation with 100% oxygen Induction Type: IV induction Laryngoscope Size: Miller and 3 Grade View: Grade I Tube type: Oral Tube size: 7.5 mm Number of attempts: 1 Airway Equipment and Method: Stylet and Oral airway Placement Confirmation: ETT inserted through vocal cords under direct vision,  positive ETCO2 and breath sounds checked- equal and bilateral Secured at: 22 cm Tube secured with: Tape Dental Injury: Teeth and Oropharynx as per pre-operative assessment

## 2019-06-09 NOTE — H&P (Signed)
Addendum H&P:  No change in patient's clinical exam from patient's last office visit of 06/08/2019. Plan on moving forward with revision of the inferior SI fusion screw.  Reviewed the case as well as risks and benefits with the patient and all of his questions were encouraged and addressed.

## 2019-06-09 NOTE — Op Note (Signed)
Operative report  Preoperative diagnosis: Symptomatic hardware.  Painful right SI joint fusion screw.  Postoperative diagnosis: Same  Operative procedure: Revision right SI fusion.  Removal and reimplantation of I fuse screw.  First Assistant: Cleta Alberts, PA  Complications: None  Implant: I fuse: 35 mm length screw.  Indications: Steven Santos is a very pleasant 48 year old gentleman who underwent an SI fusion approximately 7 months ago.  Initially he did well and then began having increasing gluteal pain.  CT scan demonstrated that the lower SI fusion screw was prominent in the gluteal musculature.  Patient underwent a CT-guided Marcaine injection at the tip of the screw and had near complete relief of his pain.  As result of the temporary relief we elected to remove the painful screw and reinserted so was no longer prominent.  Risks benefits and alternatives to surgery were discussed with the patient and consent was obtained.  Operative report: Patient was brought the operating room placed upon the operating room table.  After successful induction of general anesthesia and endotracheal ovation teds SCDs were applied and he was turned prone onto a chest and pelvic roll.  The right gluteal region was prepped and draped in a standard fashion.  Timeout was taken to confirm patient procedure and all other important data.  The previous incision was infiltrated with quarter percent Marcaine with epinephrine and the inferior portion was reincised.  Sharp dissection was carried out down to the deep fascia I bluntly dissected down until I could palpate the lower I fuse device.  I could palpate the prominence of it and I confirmed with fluoroscopy.  The inserting device was placed into the I fuse device and then I used the small osteotomes to free up the I fuse device and back it out and remove it.  I then inserted a new pin and drove it across the SI joint and into the sacrum.  I confirmed satisfactory position in  the inlet outlet and lateral view.  I then placed the broaching device over the guidepin and advanced the broach so that it crossed of the SI joint into the sacrum.  I obtained a new 35 mm length screw and advanced it across the iliac wing and into the sacrum.  Using inlet outlet fluoroscopy views I confirmed that I was within the sacrum itself.  I could then palpate and confirm that the screw was countersunk and was not prominent as it was prior to surgery.  At this point time final x-rays were taken which demonstrated satisfactory position of the new I fuse screw.  I then used quarter percent Marcaine with Exparel in the soft tissue and at the periosteum to aid in postoperative analgesia.  The wound was irrigated copiously with normal saline and closed in a layered fashion with interrupted #1 Vicryl suture, 2-0 Vicryl suture, and a 3-0 Monocryl.  Steri-Strips and a dry dressing were applied.  Patient was ultimately extubated and transferred to the PACU without incident.  The end of the case all needle sponge counts were correct.

## 2019-06-09 NOTE — Anesthesia Preprocedure Evaluation (Addendum)
Anesthesia Evaluation  Patient identified by MRN, date of birth, ID band Patient awake    Reviewed: Allergy & Precautions, NPO status , Patient's Chart, lab work & pertinent test results  History of Anesthesia Complications Negative for: history of anesthetic complications  Airway Mallampati: II  TM Distance: >3 FB Neck ROM: Full    Dental no notable dental hx.    Pulmonary asthma , former smoker,    Pulmonary exam normal        Cardiovascular negative cardio ROS Normal cardiovascular exam     Neuro/Psych Anxiety negative neurological ROS     GI/Hepatic Neg liver ROS, GERD  Controlled,  Endo/Other  negative endocrine ROS  Renal/GU negative Renal ROS  negative genitourinary   Musculoskeletal negative musculoskeletal ROS (+)   Abdominal   Peds  Hematology negative hematology ROS (+)   Anesthesia Other Findings Day of surgery medications reviewed with patient.  Reproductive/Obstetrics negative OB ROS                           Anesthesia Physical Anesthesia Plan  ASA: II  Anesthesia Plan: General   Post-op Pain Management:    Induction: Intravenous  PONV Risk Score and Plan: 3 and Treatment may vary due to age or medical condition, Ondansetron, Dexamethasone and Midazolam  Airway Management Planned: Oral ETT  Additional Equipment: None  Intra-op Plan:   Post-operative Plan: Extubation in OR  Informed Consent: I have reviewed the patients History and Physical, chart, labs and discussed the procedure including the risks, benefits and alternatives for the proposed anesthesia with the patient or authorized representative who has indicated his/her understanding and acceptance.     Dental advisory given  Plan Discussed with: CRNA  Anesthesia Plan Comments:        Anesthesia Quick Evaluation

## 2019-06-09 NOTE — Transfer of Care (Signed)
Immediate Anesthesia Transfer of Care Note  Patient: Steven Santos  Procedure(s) Performed: Removal and reposition of lower right sacroiliac screw (Right Back)  Patient Location: PACU  Anesthesia Type:General  Level of Consciousness: awake, oriented and patient cooperative  Airway & Oxygen Therapy: Patient Spontanous Breathing and Patient connected to nasal cannula oxygen  Post-op Assessment: Report given to RN and Post -op Vital signs reviewed and stable  Post vital signs: Reviewed  Last Vitals:  Vitals Value Taken Time  BP 132/72 06/09/19 1449  Temp    Pulse 79 06/09/19 1451  Resp 17 06/09/19 1451  SpO2 100 % 06/09/19 1451  Vitals shown include unvalidated device data.  Last Pain:  Vitals:   06/09/19 1204  TempSrc:   PainSc: 4          Complications: No apparent anesthesia complications

## 2019-06-09 NOTE — Brief Op Note (Signed)
06/09/2019  2:22 PM  PATIENT:  Steven Santos  48 y.o. male  PRE-OPERATIVE DIAGNOSIS:  Prominent lower sacroiliac screw right side  POST-OPERATIVE DIAGNOSIS:  Prominent lower sacroiliac screw right side  PROCEDURE:  Procedure(s) with comments: Removal and reposition of lower right sacroiliac screw (Right) - 45 mins  SURGEON:  Surgeon(s) and Role:    Melina Schools, MD - Primary  PHYSICIAN ASSISTANT:   ASSISTANTS: Amanda Ward, PA   ANESTHESIA:   general  EBL:  minimal   BLOOD ADMINISTERED:none  DRAINS: none   LOCAL MEDICATIONS USED:  MARCAINE    and OTHER exparel  SPECIMEN:  No Specimen  DISPOSITION OF SPECIMEN:  N/A  COUNTS:  YES  TOURNIQUET:  * No tourniquets in log *  DICTATION: .Dragon Dictation  PLAN OF CARE: Discharge to home after PACU  PATIENT DISPOSITION:  PACU - hemodynamically stable.

## 2019-06-10 ENCOUNTER — Encounter: Payer: Self-pay | Admitting: *Deleted

## 2019-06-13 NOTE — Anesthesia Postprocedure Evaluation (Signed)
Anesthesia Post Note  Patient: Steven Santos  Procedure(s) Performed: Removal and reposition of lower right sacroiliac screw (Right Back)     Patient location during evaluation: PACU Anesthesia Type: General Level of consciousness: awake and alert and oriented Pain management: pain level controlled Vital Signs Assessment: post-procedure vital signs reviewed and stable Respiratory status: spontaneous breathing, nonlabored ventilation and respiratory function stable Cardiovascular status: blood pressure returned to baseline Postop Assessment: no apparent nausea or vomiting Anesthetic complications: no    Last Vitals:  Vitals:   06/09/19 1504 06/09/19 1519  BP: 130/85 128/88  Pulse: 71 71  Resp: 15 15  Temp:    SpO2: 100% 100%    Last Pain:  Vitals:   06/09/19 1519  TempSrc:   PainSc: 0-No pain   Pain Goal:                   Kaylyn Layer

## 2019-08-18 ENCOUNTER — Other Ambulatory Visit: Payer: Self-pay | Admitting: Family Medicine

## 2019-08-18 DIAGNOSIS — R7989 Other specified abnormal findings of blood chemistry: Secondary | ICD-10-CM

## 2019-08-24 ENCOUNTER — Ambulatory Visit
Admission: RE | Admit: 2019-08-24 | Discharge: 2019-08-24 | Disposition: A | Discharge status: Home / Self Care | Payer: BC Managed Care – PPO | Source: Ambulatory Visit | Attending: Family Medicine | Admitting: Family Medicine

## 2019-08-24 DIAGNOSIS — R7989 Other specified abnormal findings of blood chemistry: Secondary | ICD-10-CM

## 2020-03-16 ENCOUNTER — Other Ambulatory Visit: Payer: Self-pay | Admitting: Physician Assistant

## 2020-03-16 DIAGNOSIS — R0989 Other specified symptoms and signs involving the circulatory and respiratory systems: Secondary | ICD-10-CM

## 2020-03-17 ENCOUNTER — Ambulatory Visit
Admission: RE | Admit: 2020-03-17 | Discharge: 2020-03-17 | Disposition: A | Discharge status: Home / Self Care | Payer: BC Managed Care – PPO | Source: Ambulatory Visit | Attending: Physician Assistant | Admitting: Physician Assistant

## 2020-03-17 DIAGNOSIS — R0989 Other specified symptoms and signs involving the circulatory and respiratory systems: Secondary | ICD-10-CM

## 2020-06-30 ENCOUNTER — Other Ambulatory Visit: Payer: Self-pay | Admitting: Physician Assistant

## 2020-06-30 ENCOUNTER — Ambulatory Visit
Admission: RE | Admit: 2020-06-30 | Discharge: 2020-06-30 | Disposition: A | Discharge status: Home / Self Care | Payer: BC Managed Care – PPO | Source: Ambulatory Visit | Attending: Physician Assistant | Admitting: Physician Assistant

## 2020-06-30 DIAGNOSIS — T1490XA Injury, unspecified, initial encounter: Secondary | ICD-10-CM

## 2020-10-25 ENCOUNTER — Other Ambulatory Visit: Payer: Self-pay | Admitting: Neurological Surgery

## 2020-10-25 DIAGNOSIS — M461 Sacroiliitis, not elsewhere classified: Secondary | ICD-10-CM

## 2020-11-10 ENCOUNTER — Ambulatory Visit
Admission: RE | Admit: 2020-11-10 | Discharge: 2020-11-10 | Disposition: A | Discharge status: Home / Self Care | Payer: BC Managed Care – PPO | Source: Ambulatory Visit | Attending: Neurological Surgery | Admitting: Neurological Surgery

## 2020-11-10 DIAGNOSIS — M461 Sacroiliitis, not elsewhere classified: Secondary | ICD-10-CM

## 2020-11-23 ENCOUNTER — Other Ambulatory Visit: Payer: Self-pay | Admitting: Neurological Surgery

## 2020-11-23 DIAGNOSIS — M5136 Other intervertebral disc degeneration, lumbar region: Secondary | ICD-10-CM

## 2020-11-27 IMAGING — RF DG ESOPHAGUS
10 series · 14 of 24 positions shown · non-contrast
Comparison: 03/11/2013

CLINICAL DATA: Esophageal dysphagia.  Globus sensation.

EXAM:
ESOPHOGRAM / BARIUM SWALLOW / BARIUM TABLET STUDY
TECHNIQUE: Combined double contrast and single contrast examination performed
using effervescent crystals, thick barium liquid, and thin barium
liquid. The patient was observed with fluoroscopy swallowing a 13 mm
barium sulphate tablet.
FLUOROSCOPY TIME:  Fluoroscopy Time:  2 minutes and 24 seconds
Radiation Exposure Index (if provided by the fluoroscopic device):
387 mGy
Number of Acquired Spot Images: 8

[Series 1: sequence · 0.28mm/px · 2 of 12 frames shown (1 of 9)]
[frame 2/12]
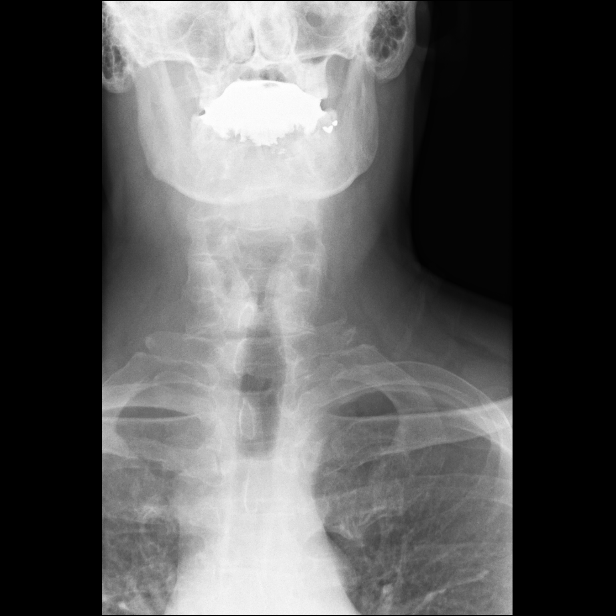
[frame 11/12]
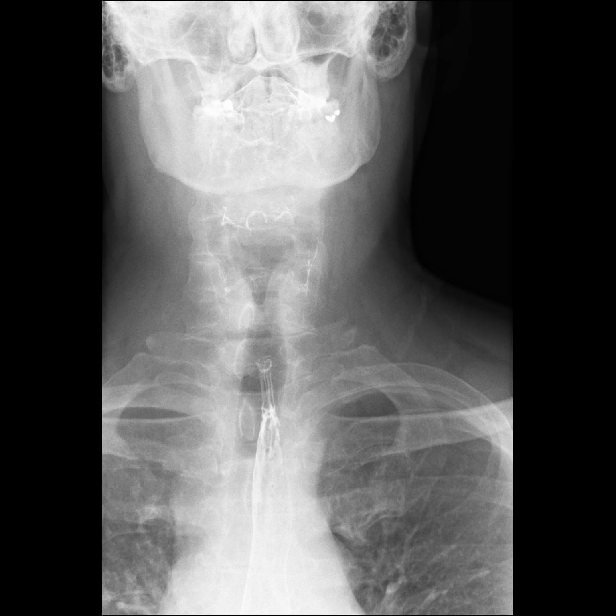

[Series 2: sequence · 0.28mm/px · 1 of 12 frames shown (2 of 9)]
[frame 7/12]
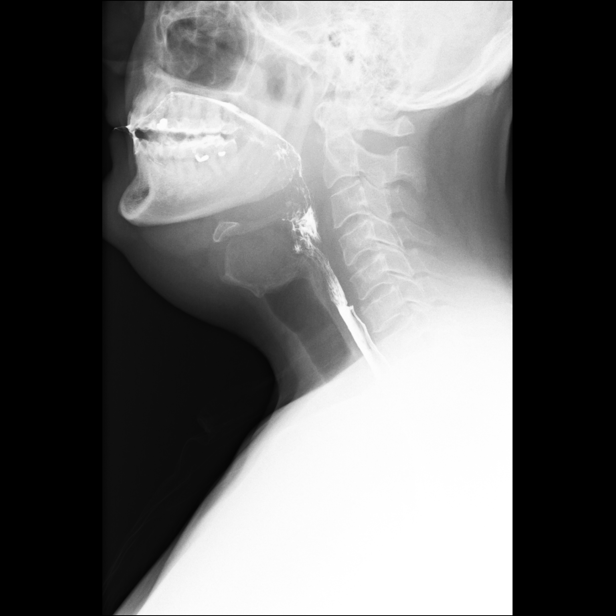

[Series 3: sequence · 0.28mm/px · 2 of 13 frames shown (3 of 9)]
[frame 7/13]
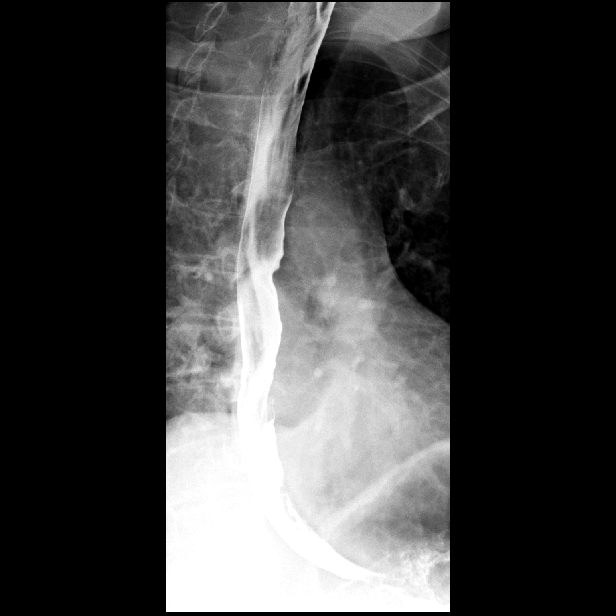
[frame 12/13]
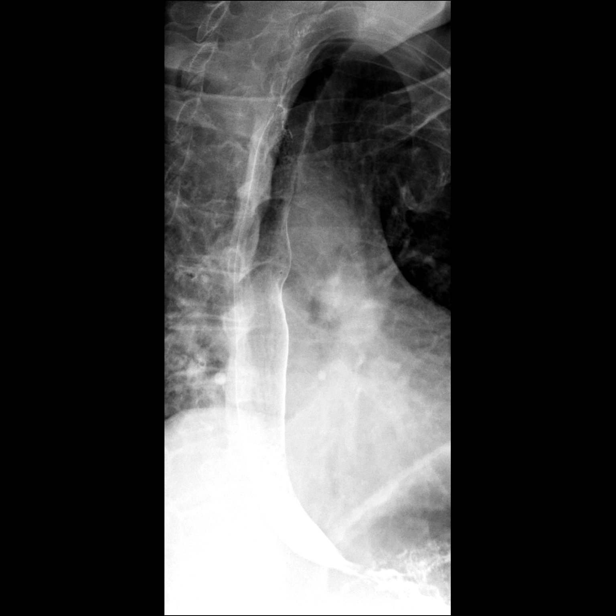

[Series 4: sequence · 0.28mm/px · 1 of 7 frames shown (4 of 9)]
[frame 6/7]
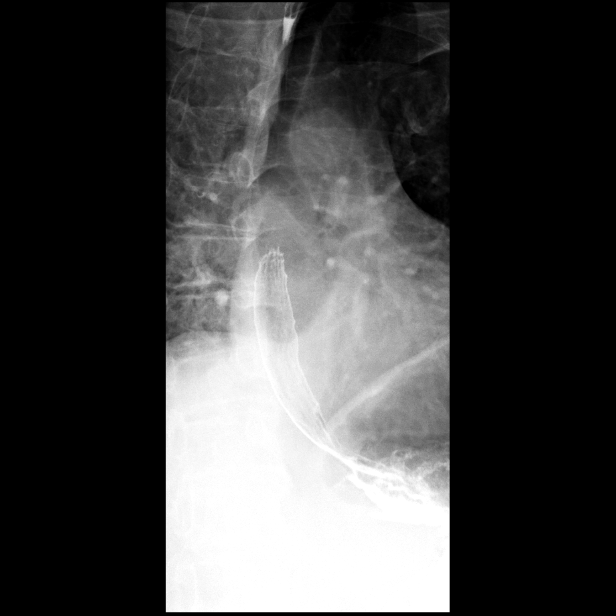

[Series 5: sequence · 1 of 15 frames shown (5 of 9)]
[frame 13/15]
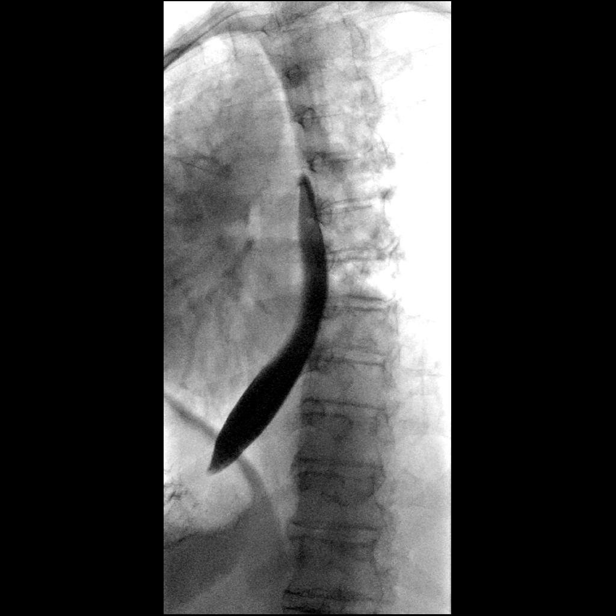

[Series 6: sequence · 1 of 3 frames shown (6 of 9)]
[frame 1/3]
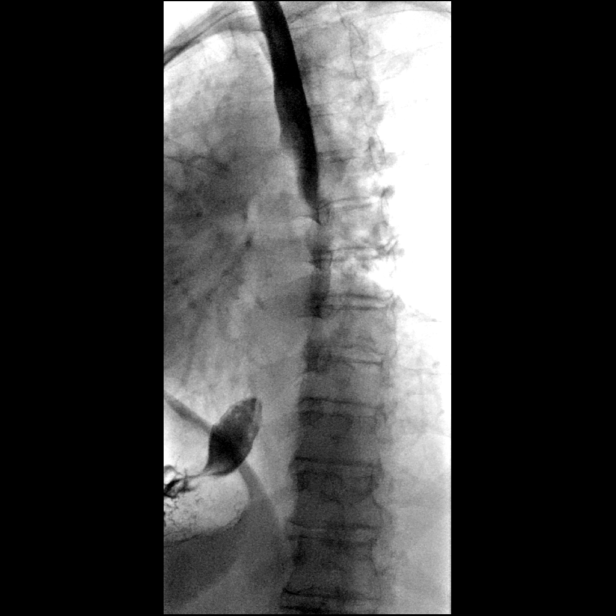

[Series 7: sequence · 1 of 25 frames shown (7 of 9)]
[frame 4/25]
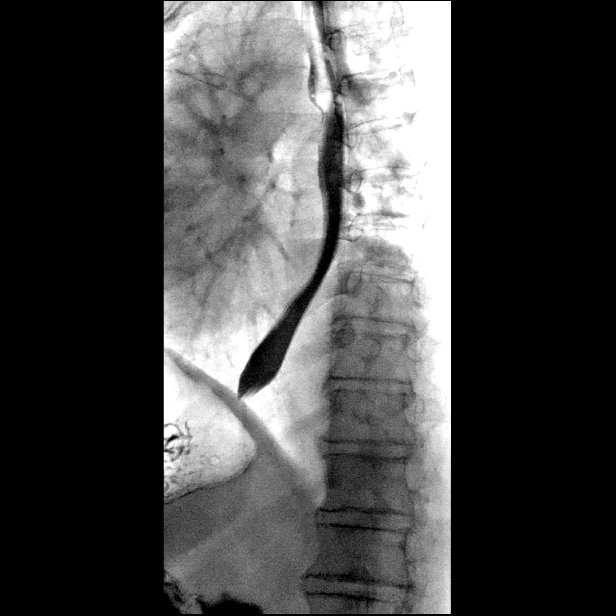

[Series 8: one shot · 1 of 2 slices shown]
[im 1/2]
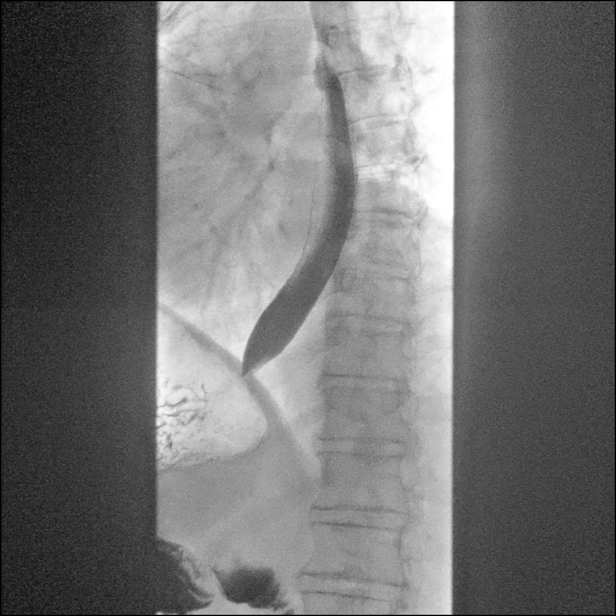

[Series 9: sequence · 2 of 34 frames shown (8 of 9)]
[frame 5/34]
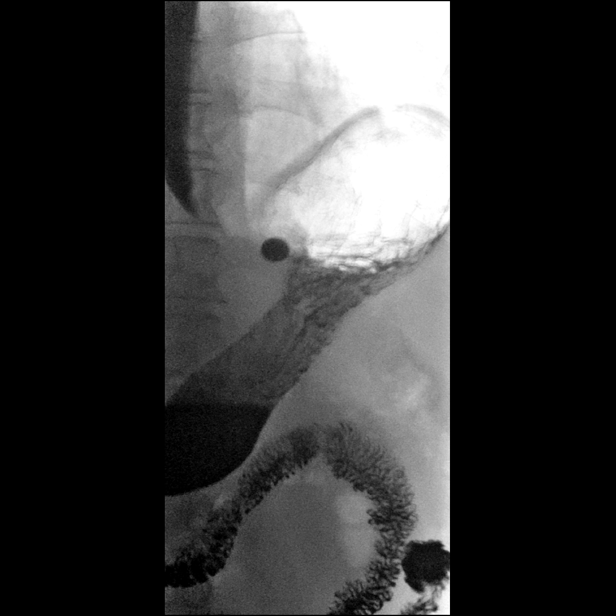
[frame 18/34]
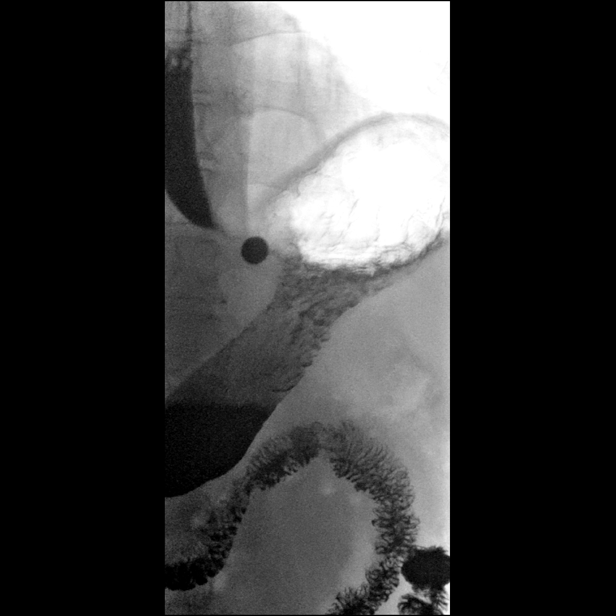

[Series 10: sequence · 2 of 40 frames shown (9 of 9)]
[frame 1/40]
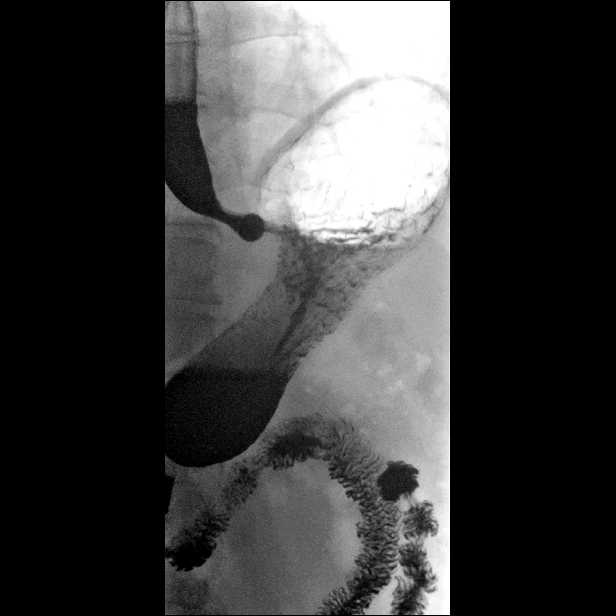
[frame 35/40]
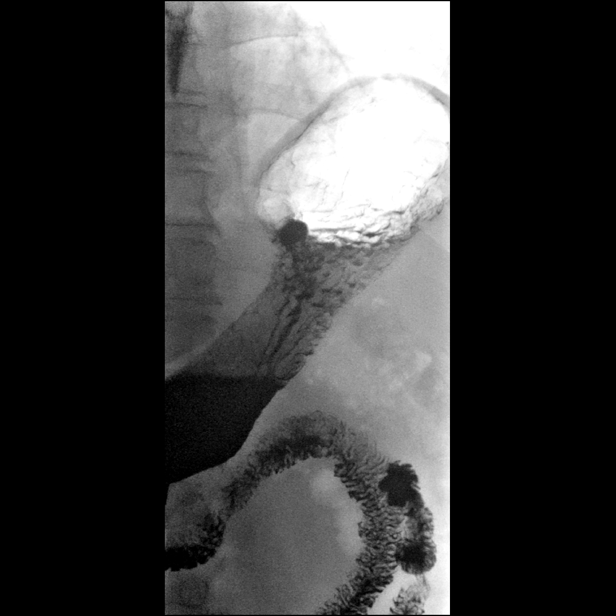

[14 of 24 positions shown; findings below may reference images not displayed]

FINDINGS: Initial barium swallows demonstrate normal pharyngeal motion with
swallowing. No laryngeal penetration or aspiration. No upper
esophageal webs, strictures or diverticuli.

Normal esophageal motility. No intrinsic or extrinsic lesions of the
esophagus were identified. No esophageal mass or stricture was
identified. However, the 13 mm barium pill did hang up for quite
some time near the GE junction. It eventually passed through after
given the patient's some heavy barium. Findings suggest a mild
stricture near the GE junction.

No GE reflux was demonstrated.
IMPRESSION: 1. Normal esophageal motility.
2. No hiatal hernia or GE reflux demonstrated.
3. 13 mm barium pill took several minutes to pass through the GE
junction. Suspect mild strictured narrowing.

## 2020-12-05 ENCOUNTER — Ambulatory Visit
Admission: RE | Admit: 2020-12-05 | Discharge: 2020-12-05 | Disposition: A | Discharge status: Home / Self Care | Payer: BC Managed Care – PPO | Source: Ambulatory Visit | Attending: Neurological Surgery | Admitting: Neurological Surgery

## 2020-12-05 ENCOUNTER — Other Ambulatory Visit: Payer: Self-pay

## 2020-12-05 DIAGNOSIS — M5136 Other intervertebral disc degeneration, lumbar region: Secondary | ICD-10-CM

## 2021-01-10 ENCOUNTER — Ambulatory Visit: Payer: Self-pay

## 2021-01-10 ENCOUNTER — Encounter: Payer: Self-pay | Admitting: Orthopedic Surgery

## 2021-01-10 ENCOUNTER — Other Ambulatory Visit: Payer: Self-pay

## 2021-01-10 ENCOUNTER — Ambulatory Visit (INDEPENDENT_AMBULATORY_CARE_PROVIDER_SITE_OTHER): Payer: BC Managed Care – PPO | Admitting: Orthopedic Surgery

## 2021-01-10 DIAGNOSIS — M25521 Pain in right elbow: Secondary | ICD-10-CM | POA: Diagnosis not present

## 2021-01-10 MED ORDER — MELOXICAM 15 MG PO TABS: ORAL_TABLET | ORAL | 0 refills | Status: DC

## 2021-01-11 ENCOUNTER — Encounter: Payer: Self-pay | Admitting: Orthopedic Surgery

## 2021-01-11 NOTE — Progress Notes (Signed)
Office Visit Note   Patient: Steven Santos           Date of Birth: [birth year]           MRN: [id number] Visit Date: 01/10/2021 Requested by: Blair Heys, MD [address] [place],  [place] [address] PCP: Blair Heys, MD  Subjective: Chief Complaint  Patient presents with   Right Elbow - New Patient (Initial Visit)    HPI: Steven Santos is a 50 year old patient with right elbow pain for the past 6 months.  Reports some soreness and tightness on the lateral aspect of his arm.  Denies any history of injury.  He is right-hand dominant.  Takes Advil for symptoms which helps some.  Denies any numbness and tingling in the arm or any neck pain.  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  No prior surgeries.  Movement increases his pain.              ROS: All systems reviewed are negative as they relate to the chief complaint within the history of present illness.  Patient denies  fevers or chills.   Assessment & Plan: Visit Diagnoses:  1. 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  Plan is Mobic every other day for pain relief.  Also talked to him about not doing too much in terms of weight lifting to avoid the compressive force across the elbow.  Follow-up as needed.  No surgical indication at this time.  Could consider joint injection if symptoms worsen.  Follow-Up Instructions: Return if symptoms worsen or fail to improve.   Orders:  Orders Placed This Encounter  Procedures   XR Elbow 2 Views Right   Meds ordered this encounter  Medications   meloxicam (MOBIC) 15 MG tablet    Sig: 1 po q d prn    Dispense:  40 tablet    Refill:  0      Procedures: No procedures performed   Clinical Data: No additional findings.  Objective: Vital Signs: There  were no vitals taken for this visit.  Physical Exam:   Constitutional: Patient appears well-developed HEENT:  Head: Normocephalic Eyes:EOM are normal Neck: Normal range of motion Cardiovascular: Normal rate Pulmonary/chest: Effort normal Neurologic: Patient is alert Skin: Skin is warm Psychiatric: Patient has normal mood and affect   Ortho Exam: Ortho exam demonstrates full pronation supination of the right elbow.  Flexion extension arc of motion on the right is 10-1 10 on the left 0-1 30.  He has no pain with direct palpation over the lateral condyle and no pain with resisted wrist extension.  No subluxation of the ulnar nerve on the right.  No tenderness in the radial tunnel.  Shoulder range of motion is full.  He does have a little bit of trouble getting his hand to the back of his head.  Specialty Comments:  No specialty comments available.  Imaging: XR Elbow 2 Views Right  Result Date: 01/11/2021 AP lateral radiographs right elbow reviewed.  Small anterior spur noted proximal to the olecranon fossa anteriorly.  No loose bodies present.  No enthesopathic calcifications around the medial or lateral epicondyle.  There is some mild medial ulnohumeral joint space narrowing present.    PMFS History: There are no problems to  display for this patient.  Past Medical History:  Diagnosis Date   Anxiety    Asthma    GERD (gastroesophageal reflux disease)    Hyperlipidemia    Stress     Family History  Problem Relation Age of Onset   Allergic rhinitis Father    Angioedema Neg Hx    Asthma Neg Hx    Eczema Neg Hx    Immunodeficiency Neg Hx    Urticaria Neg Hx     Past Surgical History:  Procedure Laterality Date   BACK SURGERY  01/08/2019   NO PAST SURGERIES     SACROILIAC JOINT FUSION Right 06/09/2019   Procedure: Removal and reposition of lower right sacroiliac screw;  Surgeon: Venita Lick, MD;  Location: [facility];  Service: Orthopedics;  Laterality: Right;  45 mins    Social History   Occupational History   Not on file  Tobacco Use   Smoking status: Former   Smokeless tobacco: Former  Building services engineer Use: Never used  Substance and Sexual Activity   Alcohol use: Yes    Comment: occasional   Drug use: No   Sexual activity: Not on file

## 2021-02-19 ENCOUNTER — Other Ambulatory Visit: Payer: Self-pay | Admitting: Orthopedic Surgery

## 2021-02-19 NOTE — Telephone Encounter (Signed)
Needs updated BMP to check kidney function prior to refill. Last blood work from [facility]

## 2021-02-20 NOTE — Telephone Encounter (Signed)
Attempted to contact patient however had to leave a voicemail. Informed patient that he would need to have blood work completed to check kidney function prior to filling medication. If he has any questions or concerns he is able to contact our office back.

## 2021-02-21 NOTE — Telephone Encounter (Signed)
Ty kayla

## 2021-11-05 ENCOUNTER — Other Ambulatory Visit: Payer: Self-pay

## 2021-11-05 ENCOUNTER — Emergency Department (HOSPITAL_BASED_OUTPATIENT_CLINIC_OR_DEPARTMENT_OTHER)
Admission: EM | Admit: 2021-11-05 | Discharge: 2021-11-05 | Disposition: A | Discharge status: Home / Self Care | Payer: BC Managed Care – PPO | Attending: Emergency Medicine | Admitting: Emergency Medicine

## 2021-11-05 ENCOUNTER — Encounter (HOSPITAL_BASED_OUTPATIENT_CLINIC_OR_DEPARTMENT_OTHER): Payer: Self-pay | Admitting: Emergency Medicine

## 2021-11-05 DIAGNOSIS — T7840XA Allergy, unspecified, initial encounter: Secondary | ICD-10-CM | POA: Diagnosis not present

## 2021-11-05 DIAGNOSIS — R21 Rash and other nonspecific skin eruption: Secondary | ICD-10-CM | POA: Diagnosis present

## 2021-11-05 HISTORY — DX: Other adverse food reactions, not elsewhere classified, initial encounter: T78.1XXA

## 2021-11-05 MED ORDER — PREDNISONE 20 MG PO TABS: ORAL_TABLET | ORAL | 0 refills | Status: DC

## 2021-11-05 MED ORDER — EPINEPHRINE 0.3 MG/0.3ML IJ SOAJ: 0.3000 mg | INTRAMUSCULAR | 0 refills | Status: AC | PRN

## 2021-11-05 MED ORDER — DIPHENHYDRAMINE HCL 50 MG/ML IJ SOLN
25.0000 mg | Freq: Once | INTRAMUSCULAR | Status: DC
  Filled 2021-11-05: qty 1

## 2021-11-05 MED ORDER — FAMOTIDINE 20 MG PO TABS: 20.0000 mg | ORAL_TABLET | Freq: Two times a day (BID) | ORAL | 0 refills | Status: AC

## 2021-11-05 MED ORDER — FAMOTIDINE IN NACL 20-0.9 MG/50ML-% IV SOLN
20.0000 mg | Freq: Once | INTRAVENOUS | Status: AC
  Administered 2021-11-05: 20 mg via INTRAVENOUS
  Filled 2021-11-05: qty 50

## 2021-11-05 MED ORDER — METHYLPREDNISOLONE SODIUM SUCC 125 MG IJ SOLR
125.0000 mg | INTRAMUSCULAR | Status: AC
  Administered 2021-11-05: 125 mg via INTRAVENOUS
  Filled 2021-11-05: qty 2

## 2021-11-05 NOTE — ED Provider Notes (Signed)
[facility] EMERGENCY DEPARTMENT Provider Note   CSN: [id number] Arrival date & time: 11/05/21  0248     History  Chief Complaint  Patient presents with   Allergic Reaction    Steven Santos is a 51 y.o. male.  The history is provided by the patient.  Allergic Reaction Presenting symptoms: rash   Presenting symptoms: no difficulty breathing and no wheezing   Severity:  Moderate Prior allergic episodes:  Seasonal allergies Context: not animal exposure   Relieved by:  Nothing Worsened by:  Nothing Ineffective treatments:  None tried     Home Medications Prior to Admission medications   Medication Sig Start Date End Date Taking? Authorizing Provider  albuterol (PROVENTIL HFA;VENTOLIN HFA) 108 (90 Base) MCG/ACT inhaler Inhale 2 puffs into the lungs every 6 (six) hours as needed for wheezing or shortness of breath.     [provider]  cetirizine (ZYRTEC) 10 MG tablet Take 10 mg by mouth daily as needed for allergies.     [provider]  citalopram (CELEXA) 40 MG tablet Take 40 mg by mouth at bedtime.     [provider]  EPINEPHrine (AUVI-Q) 0.3 mg/0.3 mL IJ SOAJ injection Use as directed for severe allergic reaction Patient taking differently: Inject 0.3 mg into the muscle once as needed (for a severe allergic reaction). 01/17/16   Alfonse Spruce, MD  gemfibrozil (LOPID) 600 MG tablet Take 600 mg by mouth 2 (two) times daily.    [provider]  meloxicam (MOBIC) 15 MG tablet 1 po q d prn 01/10/21   Cammy Copa, MD  ondansetron (ZOFRAN) 4 MG tablet Take 1 tablet (4 mg total) by mouth every 8 (eight) hours as needed for nausea or vomiting. 06/09/19   Venita Lick, MD      Allergies    Patient has no known allergies.    Review of Systems   Review of Systems  Constitutional:  Negative for fever.  HENT:  Negative for drooling and facial swelling.   Eyes:  Negative for redness.  Respiratory:  Negative for  wheezing and stridor.   Cardiovascular:  Negative for chest pain.  Skin:  Positive for rash.   Physical Exam Updated Vital Signs BP 133/85 (BP Location: Right Arm)   Pulse 60   Temp 98.3 F (36.8 C) (Oral)   Resp 18   Ht 6\' 1"  (1.854 m)   Wt 104.3 kg   SpO2 96%   BMI 30.34 kg/m  Physical Exam Vitals and nursing note reviewed.  Constitutional:      General: He is not in acute distress.    Appearance: Normal appearance. He is well-developed. He is not diaphoretic.  HENT:     Head: Normocephalic and atraumatic.     Nose: Nose normal.     Mouth/Throat:     Mouth: Mucous membranes are moist.     Pharynx: Oropharynx is clear.     Comments: No swelling of the lips tongue or uvula Eyes:     Conjunctiva/sclera: Conjunctivae normal.     Pupils: Pupils are equal, round, and reactive to light.  Cardiovascular:     Rate and Rhythm: Normal rate and regular rhythm.     Pulses: Normal pulses.     Heart sounds: Normal heart sounds.  Pulmonary:     Effort: Pulmonary effort is normal.     Breath sounds: Normal breath sounds. No stridor. No wheezing or rales.  Abdominal:     General: Bowel sounds  are normal.     Palpations: Abdomen is soft.     Tenderness: There is no abdominal tenderness. There is no guarding or rebound.  Musculoskeletal:        General: Normal range of motion.     Cervical back: Normal range of motion and neck supple.  Skin:    General: Skin is warm and dry.     Comments: Papular eruption of the abdomen  Neurological:     General: No focal deficit present.     Mental Status: He is alert and oriented to person, place, and time.     Deep Tendon Reflexes: Reflexes normal.  Psychiatric:        Mood and Affect: Mood normal.        Behavior: Behavior normal.    ED Results / Procedures / Treatments   Labs (all labs ordered are listed, but only abnormal results are displayed) Labs Reviewed - No data to display  EKG None  Radiology No results  found.  Procedures Procedures    Medications Ordered in ED Medications  methylPREDNISolone sodium succinate (SOLU-MEDROL) 125 mg/2 mL injection 125 mg (has no administration in time range)  famotidine (PEPCID) IVPB 20 mg premix (has no administration in time range)  diphenhydrAMINE (BENADRYL) injection 25 mg (has no administration in time range)    ED Course/ Medical Decision Making/ A&P                           Medical Decision Making Allergic reaction to unknown substance.  Benadryl 2 tabs taken at home   Amount and/or Complexity of Data Reviewed Independent Historian: spouse    Details: see above  Risk Prescription drug management. Risk Details: Very well, symptoms improved in the ED.  Will start steroids and pepcid and have advised patient starting daily zyrtec and following up with allergy and immunology.      Final Clinical Impression(s) / ED Diagnoses Final diagnoses:  None  Return for intractable cough, coughing up blood, fevers > 100.4 unrelieved by medication, shortness of breath, intractable vomiting, chest pain, shortness of breath, weakness, numbness, changes in speech, facial asymmetry, abdominal pain, passing out, Inability to tolerate liquids or food, cough, altered mental status or any concerns. No signs of systemic illness or infection. The patient is nontoxic-appearing on exam and vital signs are within normal limits.  I have reviewed the triage vital signs and the nursing notes. Pertinent labs & imaging results that were available during my care of the patient were reviewed by me and considered in my medical decision making (see chart for details). After history, exam, and medical workup I feel the patient has been appropriately medically screened and is safe for discharge home. Pertinent diagnoses were discussed with the patient. Patient was given return precautions.   Rx / DC Orders ED Discharge Orders     None         Keanna Tugwell, MD 11/05/21  7628

## 2021-11-05 NOTE — ED Triage Notes (Signed)
Reports having milk last night and waking up with rash all over with nausea and vomiting.  Hx of alpha gal.  Used his old epipen and took benadryl 50mg .

## 2022-01-04 NOTE — Progress Notes (Addendum)
Office Visit Note  Patient: Steven Santos             Date of Birth: Aug 09, 1970           MRN: [id number]             PCP: Gaynelle Arabian, MD Referring: Gaynelle Arabian, MD Visit Date: 01/16/2022 Occupation: '@GUAROCC' @  Subjective:  Pain in entire spine  History of Present Illness: Steven Santos is a 51 y.o. 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  He leads a very active lifestyle.  He enjoys hunting fishing and camping.  He states in his early 33s he started having lower back pain which gradually got worse.  He states gradually the pain moved to his neck upper back and lower back.  He states in 2020 he was having severe lower back pain and nocturnal pain.  He was evaluated by Dr. Dossie Der who did MRI of his SI joints which was negative and he had no further appointments with her.  He was evaluated by Dr. Rolena Infante who diagnosed her with SI joint arthritis and he underwent right SI joint fusion on June 09, 2019 according to the patient the pain in the SI joint improved after the surgery but the entire spine continues to be painful.  He was given prednisone pack several times in the past which has helped.  He recalls taking a prednisone pack last spring which was helpful as well.  He was placed on meloxicam by Dr.Ehinger which helped his symptoms but caused swelling and itching in his hands and he had to come off the medication.  He has also seen Dr. Reatha Armour for lower back pain.  He has had epidural injections through his office which has been helpful.  He continues to have pain in his entire spine.  There is no history of tendinitis, Achilles tendinitis of Planter fasciitis or bursitis.  There is no history of inflammatory arthritis.  He denies any history of uveitis or shortness of breath.  There is history of Crohn's disease in his father.  He has 1 sibling and 2 children who are in good health.  Activities of Daily Living:  Patient reports morning stiffness for 0 minutes.   Patient  Reports nocturnal pain.  Difficulty dressing/grooming: Reports Difficulty climbing stairs: Denies Difficulty getting out of chair: Reports Difficulty using hands for taps, buttons, cutlery, and/or writing: Denies  Review of Systems  Constitutional:  Negative for fatigue.  HENT:  Negative for mouth sores and mouth dryness.   Eyes:  Negative for dryness.  Respiratory:  Negative for shortness of breath.   Cardiovascular:  Negative for chest pain and palpitations.  Gastrointestinal:  Negative for blood in stool, constipation and diarrhea.  Endocrine: Negative for increased urination.  Genitourinary:  Negative for involuntary urination.  Musculoskeletal:  Positive for joint pain and joint pain. Negative for gait problem, joint swelling, myalgias, muscle weakness, morning stiffness, muscle tenderness and myalgias.  Skin:  Negative for color change, rash, hair loss and sensitivity to sunlight.  Allergic/Immunologic: Negative for susceptible to infections.  Neurological:  Negative for dizziness and headaches.  Hematological:  Negative for swollen glands.  Psychiatric/Behavioral:  Positive for sleep disturbance. Negative for depressed mood. The patient is not nervous/anxious.     PMFS History:  There are no problems to display for this patient.   Past Medical History:  Diagnosis Date   Allergic reaction to alpha-gal    Anxiety    Asthma  GERD (gastroesophageal reflux disease)    Hyperlipidemia    Stress     Family History  Problem Relation Age of Onset   Allergic rhinitis Father    Rheum arthritis Father    Crohn's disease Father    Healthy Sister    Healthy Son    Healthy Daughter    Angioedema Neg Hx    Asthma Neg Hx    Eczema Neg Hx    Immunodeficiency Neg Hx    Urticaria Neg Hx    Past Surgical History:  Procedure Laterality Date   BACK SURGERY  01/08/2019   NO PAST SURGERIES     SACROILIAC JOINT FUSION Right 06/09/2019   Procedure: Removal and reposition of lower  right sacroiliac screw;  Surgeon: Melina Schools, MD;  Location: [facility];  Service: Orthopedics;  Laterality: Right;  45 mins   Social History   Social History Narrative   Not on file    There is no immunization history on file for this patient.   Objective: Vital Signs: BP (!) 141/88 (BP Location: Right Arm, Patient Position: Sitting, Cuff Size: Normal)   Pulse 66   Resp 18   Ht '6\' 1"'  (1.854 m)   Wt 227 lb 12.8 oz (103.3 kg)   BMI 30.05 kg/m    Physical Exam Vitals and nursing note reviewed.  Constitutional:      Appearance: He is well-developed.  HENT:     Head: Normocephalic and atraumatic.  Eyes:     Conjunctiva/sclera: Conjunctivae normal.     Pupils: Pupils are equal, round, and reactive to light.  Cardiovascular:     Rate and Rhythm: Normal rate and regular rhythm.     Heart sounds: Normal heart sounds.  Pulmonary:     Effort: Pulmonary effort is normal.     Breath sounds: Normal breath sounds.  Abdominal:     General: Bowel sounds are normal.     Palpations: Abdomen is soft.  Musculoskeletal:     Cervical back: Normal range of motion and neck supple.  Skin:    General: Skin is warm and dry.     Capillary Refill: Capillary refill takes less than 2 seconds.  Neurological:     Mental Status: He is alert and oriented to person, place, and time.  Psychiatric:        Behavior: Behavior normal.      Musculoskeletal Exam: He had limited range of motion of the cervical spine.  He had no point tenderness over the entire spine.  He had limited range of motion of the thoracic and lumbar spine.  Schober's was positive.  He had no SI joint tenderness.  Shoulder joints were in good range of motion.  Right elbow joint appears to have contracture without any synovitis.  Left elbow joint was in full range of motion.  There was no synovitis over wrist joints MCPs PIPs or DIPs.  Bilateral PIP and DIP thickening consistent with osteoarthritis was noted.  Hip joints and knee joints  with good range of motion.  There was no tenderness over ankles or MTPs.  There was no evidence of Planter fasciitis or Achilles tendinitis.  There was no evidence of trochanteric bursitis.  CDAI Exam: CDAI Score: -- Patient Global: --; Provider Global: -- Swollen: --; Tender: -- Joint Exam 01/16/2022   No joint exam has been documented for this visit   There is currently no information documented on the homunculus. Go to the Rheumatology activity and complete the homunculus joint exam.  Investigation: No additional findings.  Imaging: No results found.  Recent Labs: Lab Results  Component Value Date   WBC 6.8 06/08/2019   HGB 15.3 06/08/2019   PLT 251 06/08/2019   NA 142 06/08/2019   K 4.1 06/08/2019   CL 104 06/08/2019   CO2 25 06/08/2019   GLUCOSE 106 (H) 06/08/2019   BUN 20 06/08/2019   CREATININE 1.21 06/08/2019   CALCIUM 9.7 06/08/2019   GFRAA >60 06/08/2019    Speciality Comments: No specialty comments available.  Procedures:  No procedures performed Allergies: Alpha-gal   Assessment / Plan:     Visit Diagnoses: Chronic back pain, unspecified back location, unspecified back pain laterality-patient complains of pain in his entire spine since he was in his 41s.  He states the pain has been progressively getting worse.  He had recent epidural injections by Dr. Reatha Armour.  He had good response to prednisone during the spring.  He has also had prednisone tapers in the past.  He had good response to meloxicam but it caused itching and swelling in his hands.  He states he has taken Celebrex in the past which was helpful.  I discussed possible trial of Biologics if his sed rate is elevated.  I also discussed a trial of Celebrex and patient is willing to try it.  I will call in a prescription for Celebrex 200 mg p.o. twice daily with meals after the labs are available..  Side effects of Celebrex including increased risk of GI bleed, hepatorenal toxicity was discussed.  HLA B27  (HLA B27 positive) - 05/13/18:HLA-B27+, ANA negative, CRP 6, RF<10, CPK 117, Lyme-, ESR 5,  Cervical pain -he had limited painful range of motion of the cervical spine.  Plan: XR Cervical Spine 2 or 3 views.  Mild spondylosis and facet joint arthropathy was noted.  Pain in thoracic spine -he had limited painful range of motion of the thoracic spine.  Plan: XR Thoracic Spine 2 View.  Anterior and lateral osteophytes are noted.  DDD (degenerative disc disease), lumbar-he has limited painful range of motion of the lumbar spine.  I reviewed MRI of the lumbar spine from December 05, 2020 read by Dr. Maree Erie which showed mild thoracolumbar kyphotic curvature.  Degenerative disc disease and mild facet joint arthropathy.  According to his note there was no radiographic progression when compared to 2015.  Previous SI joint fusion was also noted.  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 No SI joint effusion, SI joint widening, subchondral edema, or erosive changes. -He has chronic SI joint pain.  He had right SI joint fusion by Dr. Rolena Infante in 2020 which helped.  Plan: XR Pelvis 1-2 Views, postsurgical right SI joint fusion was noted.  SI joint sclerosis and narrowing was noted in the left SI joint.  Sedimentation rate  Primary osteoarthritis of both hands-he denies any discomfort in his hands.  Bilateral PIP and DIP thickening consistent with osteoarthritis was noted.  Other fatigue -he relates fatigue to chronic insomnia.  Plan: CBC with Differential/Platelet, COMPLETE METABOLIC PANEL WITH GFR  Insomnia due to medical condition-he has been having nocturnal pain which causes insomnia.  History of hyperlipidemia-he is on Lopid.  History of anxiety-he takes Celexa.  Seasonal allergies-he uses albuterol and Zyrtec on as needed basis.  Allergy to alpha-gal  Family history of Crohn's disease-father.  Patient recalls his father had Crohn's disease  and inflammatory arthritis and was on many medications.  Orders: Orders  Placed This Encounter  Procedures   XR Cervical Spine 2 or 3 views   XR Thoracic Spine 2 View   XR Pelvis 1-2 Views   CBC with Differential/Platelet   COMPLETE METABOLIC PANEL WITH GFR   Sedimentation rate   No orders of the defined types were placed in this encounter.    Follow-Up Instructions: Return for Lower back pain, HLA-B27 positive.   Bo Merino, MD  Note - This record has been created using Editor, commissioning.  Chart creation errors have been sought, but may not always  have been located. Such creation errors do not reflect on  the standard of medical care.

## 2022-01-16 ENCOUNTER — Ambulatory Visit: Payer: BC Managed Care – PPO | Attending: Rheumatology | Admitting: Rheumatology

## 2022-01-16 ENCOUNTER — Encounter: Payer: Self-pay | Admitting: Rheumatology

## 2022-01-16 ENCOUNTER — Ambulatory Visit (INDEPENDENT_AMBULATORY_CARE_PROVIDER_SITE_OTHER): Payer: BC Managed Care – PPO

## 2022-01-16 ENCOUNTER — Other Ambulatory Visit: Payer: Self-pay

## 2022-01-16 VITALS — BP 141/88 | HR 66 | Resp 18 | Ht 73.0 in | Wt 227.8 lb

## 2022-01-16 DIAGNOSIS — Z1589 Genetic susceptibility to other disease: Secondary | ICD-10-CM | POA: Diagnosis not present

## 2022-01-16 DIAGNOSIS — Z8639 Personal history of other endocrine, nutritional and metabolic disease: Secondary | ICD-10-CM

## 2022-01-16 DIAGNOSIS — M51369 Other intervertebral disc degeneration, lumbar region without mention of lumbar back pain or lower extremity pain: Secondary | ICD-10-CM

## 2022-01-16 DIAGNOSIS — M546 Pain in thoracic spine: Secondary | ICD-10-CM

## 2022-01-16 DIAGNOSIS — M19041 Primary osteoarthritis, right hand: Secondary | ICD-10-CM

## 2022-01-16 DIAGNOSIS — M542 Cervicalgia: Secondary | ICD-10-CM

## 2022-01-16 DIAGNOSIS — G4701 Insomnia due to medical condition: Secondary | ICD-10-CM

## 2022-01-16 DIAGNOSIS — Z91018 Allergy to other foods: Secondary | ICD-10-CM

## 2022-01-16 DIAGNOSIS — M5136 Other intervertebral disc degeneration, lumbar region: Secondary | ICD-10-CM

## 2022-01-16 DIAGNOSIS — M533 Sacrococcygeal disorders, not elsewhere classified: Secondary | ICD-10-CM | POA: Diagnosis not present

## 2022-01-16 DIAGNOSIS — M549 Dorsalgia, unspecified: Secondary | ICD-10-CM | POA: Diagnosis not present

## 2022-01-16 DIAGNOSIS — G8929 Other chronic pain: Secondary | ICD-10-CM | POA: Diagnosis not present

## 2022-01-16 DIAGNOSIS — Z8379 Family history of other diseases of the digestive system: Secondary | ICD-10-CM

## 2022-01-16 DIAGNOSIS — R5383 Other fatigue: Secondary | ICD-10-CM

## 2022-01-16 DIAGNOSIS — M19042 Primary osteoarthritis, left hand: Secondary | ICD-10-CM

## 2022-01-16 DIAGNOSIS — Z8659 Personal history of other mental and behavioral disorders: Secondary | ICD-10-CM

## 2022-01-16 DIAGNOSIS — J302 Other seasonal allergic rhinitis: Secondary | ICD-10-CM

## 2022-01-16 NOTE — Telephone Encounter (Signed)
Pending lab results, patient will be starting on celebrex, per Dr. Corliss Skains. Thanks!

## 2022-01-17 LAB — CBC WITH DIFFERENTIAL/PLATELET
Absolute Monocytes: 634 cells/uL (ref 200–950)
Basophils Absolute: 29 cells/uL (ref 0–200)
Basophils Relative: 0.4 %
Eosinophils Absolute: 230 cells/uL (ref 15–500)
Eosinophils Relative: 3.2 %
HCT: 42.9 % (ref 38.5–50.0)
Hemoglobin: 15.2 g/dL (ref 13.2–17.1)
Lymphs Abs: 1699 cells/uL (ref 850–3900)
MCH: 33.2 pg — ABNORMAL HIGH (ref 27.0–33.0)
MCHC: 35.4 g/dL (ref 32.0–36.0)
MCV: 93.7 fL (ref 80.0–100.0)
MPV: 12.2 fL (ref 7.5–12.5)
Monocytes Relative: 8.8 %
Neutro Abs: 4608 cells/uL (ref 1500–7800)
Neutrophils Relative %: 64 %
Platelets: 220 10*3/uL (ref 140–400)
RBC: 4.58 10*6/uL (ref 4.20–5.80)
RDW: 11.3 % (ref 11.0–15.0)
Total Lymphocyte: 23.6 %
WBC: 7.2 10*3/uL (ref 3.8–10.8)

## 2022-01-17 LAB — COMPLETE METABOLIC PANEL WITH GFR
AG Ratio: 1.7 (calc) (ref 1.0–2.5)
ALT: 16 U/L (ref 9–46)
AST: 12 U/L (ref 10–35)
Albumin: 4.8 g/dL (ref 3.6–5.1)
Alkaline phosphatase (APISO): 93 U/L (ref 35–144)
BUN: 17 mg/dL (ref 7–25)
CO2: 27 mmol/L (ref 20–32)
Calcium: 9.4 mg/dL (ref 8.6–10.3)
Chloride: 103 mmol/L (ref 98–110)
Creat: 1.12 mg/dL (ref 0.70–1.30)
Globulin: 2.9 g/dL (calc) (ref 1.9–3.7)
Glucose, Bld: 87 mg/dL (ref 65–99)
Potassium: 4.4 mmol/L (ref 3.5–5.3)
Sodium: 139 mmol/L (ref 135–146)
Total Bilirubin: 0.4 mg/dL (ref 0.2–1.2)
Total Protein: 7.7 g/dL (ref 6.1–8.1)
eGFR: 79 mL/min/{1.73_m2} (ref >=60)

## 2022-01-17 LAB — SEDIMENTATION RATE: Sed Rate: 11 mm/h (ref 0–20)

## 2022-01-17 MED ORDER — CELECOXIB 200 MG PO CAPS: 200.0000 mg | ORAL_CAPSULE | Freq: Two times a day (BID) | ORAL | 2 refills | Status: DC

## 2022-01-17 NOTE — Telephone Encounter (Signed)
CBC, CMP, sed rate are within normal limits.   Per office note on 01/16/2022: I will call in a prescription for Celebrex 200 mg p.o. twice daily with meals after the labs are available.

## 2022-01-17 NOTE — Progress Notes (Signed)
CBC, CMP, sed rate are within normal limits.  I will discuss results at the follow-up visit.

## 2022-01-30 ENCOUNTER — Encounter: Payer: Self-pay | Admitting: Rheumatology

## 2022-02-08 NOTE — Progress Notes (Signed)
Office Visit Note  Patient: Steven Santos             Date of Birth: [birth year]           MRN: [id number]             PCP: Gaynelle Arabian, MD Referring: Gaynelle Arabian, MD Visit Date: 02/14/2022 Occupation: _0 @  Subjective:  Pain in entire spine  History of Present Illness: Steven Santos is a 51 y.o. male returns today for the follow-up visit.  He gives history of pain in his entire back since he was in his early 13s.  He has been experiencing severe back pain and nocturnal pain since 2020.  He had right SI joint fusion in December 2020 despite of that he has ongoing pain.  He had allergic response to meloxicam in the past.  He had recent lumbar epidural injection by Dr. Sherryle Lis which helped.  There is no history of uveitis, Achilles tendinitis, Planter fasciitis or tendinitis.  He denies any history of inflammatory arthritis.  He was given a prescription for Celebrex at the last visit which caused all of GI discomfort and he had to discontinue the medication.  He has been taking Advil and Tylenol for pain relief.  He wants to review x-rays today.  Activities of Daily Living:  Patient reports morning stiffness for all day. Patient Reports nocturnal pain.  Difficulty dressing/grooming: Denies Difficulty climbing stairs: Denies Difficulty getting out of chair: Reports Difficulty using hands for taps, buttons, cutlery, and/or writing: Denies  Review of Systems  Constitutional:  Positive for fatigue.  HENT:  Negative for mouth sores and mouth dryness.   Eyes:  Positive for dryness.  Respiratory:  Negative for shortness of breath.   Cardiovascular:  Negative for chest pain and palpitations.  Gastrointestinal:  Negative for blood in stool, constipation and diarrhea.  Endocrine: Negative for increased urination.  Genitourinary:  Negative for involuntary urination.  Musculoskeletal:  Positive for joint pain, joint pain, myalgias, morning stiffness, muscle tenderness and  myalgias. Negative for gait problem, joint swelling and muscle weakness.  Skin:  Negative for color change, rash, hair loss and sensitivity to sunlight.  Allergic/Immunologic: Negative for susceptible to infections.  Neurological:  Negative for dizziness and headaches.  Hematological:  Negative for swollen glands.  Psychiatric/Behavioral:  Positive for sleep disturbance. Negative for depressed mood. The patient is not nervous/anxious.     PMFS History:  Patient Active Problem List   Diagnosis Date Noted   DDD (degenerative disc disease), cervical 02/14/2022   DDD (degenerative disc disease), thoracic 02/14/2022   DDD (degenerative disc disease), lumbar 02/14/2022   Chronic right SI joint pain 02/14/2022   Primary osteoarthritis of both hands 02/14/2022   HLA B27 (HLA B27 positive) 02/14/2022   History of hyperlipidemia 02/14/2022   History of anxiety 02/14/2022   Allergy to alpha-gal 02/14/2022    Past Medical History:  Diagnosis Date   Allergic reaction to alpha-gal    Anxiety    Asthma    GERD (gastroesophageal reflux disease)    Hyperlipidemia    Stress     Family History  Problem Relation Age of Onset   Allergic rhinitis Father    Rheum arthritis Father    Crohn's disease Father    Healthy Sister    Healthy Son    Healthy Daughter    Angioedema Neg Hx    Asthma Neg Hx    Eczema Neg Hx    Immunodeficiency Neg Hx  Urticaria Neg Hx    Past Surgical History:  Procedure Laterality Date   BACK SURGERY  01/08/2019   NO PAST SURGERIES     SACROILIAC JOINT FUSION Right 06/09/2019   Procedure: Removal and reposition of lower right sacroiliac screw;  Surgeon: Melina Schools, MD;  Location: [facility];  Service: Orthopedics;  Laterality: Right;  45 mins   Social History   Social History Narrative   Not on file    There is no immunization history on file for this patient.   Objective: Vital Signs: BP 128/77 (BP Location: Left Arm, Patient Position: Sitting, Cuff Size:  Normal)   Pulse 71   Resp 16   Ht _0  (1.854 m)   Wt 227 lb 12.8 oz (103.3 kg)   BMI 30.05 kg/m    Physical Exam Vitals and nursing note reviewed.  Constitutional:      Appearance: He is well-developed.  HENT:     Head: Normocephalic and atraumatic.  Eyes:     Conjunctiva/sclera: Conjunctivae normal.     Pupils: Pupils are equal, round, and reactive to light.  Cardiovascular:     Rate and Rhythm: Normal rate and regular rhythm.     Heart sounds: Normal heart sounds.  Pulmonary:     Effort: Pulmonary effort is normal.     Breath sounds: Normal breath sounds.  Abdominal:     General: Bowel sounds are normal.     Palpations: Abdomen is soft.  Musculoskeletal:     Cervical back: Normal range of motion and neck supple.  Skin:    General: Skin is warm and dry.     Capillary Refill: Capillary refill takes less than 2 seconds.  Neurological:     Mental Status: He is alert and oriented to person, place, and time.  Psychiatric:        Behavior: Behavior normal.      Musculoskeletal Exam: He had limited lateral rotation of the cervical spine with discomfort.  He had limited range of motion of the thoracic and lumbar spine.  He had no tenderness over SI joints.  Shoulder joints, elbow joints, wrist joints were in good range of motion.  Right elbow joint has contracture with no synovitis.  PIP and DIP thickening with no synovitis was noted.  Hip joints and knee joints with good range of motion.  There was no tenderness over ankles or MTPs.  CDAI Exam: CDAI Score: -- Patient Global: --; Provider Global: -- Swollen: --; Tender: -- Joint Exam 02/14/2022   No joint exam has been documented for this visit   There is currently no information documented on the homunculus. Go to the Rheumatology activity and complete the homunculus joint exam.  Investigation: No additional findings.  Imaging: XR Pelvis 1-2 Views  Result Date: 01/16/2022 Postsurgical changes and right SI joint  fusion was noted.  Left SI joint was not well-visualized.  Some sclerosis and narrowing was noted.  No hip joint narrowing was noted. Impression: These findings are consistent with SI joint sclerosis and narrowing.  Postsurgical right SI joint fusion was noted.  XR Thoracic Spine 2 View  Result Date: 01/16/2022 No significant disc space narrowing was noted.  Anterior and lateral osteophytes were noted. Impression: These findings are consistent with degenerative disc disease of thoracic spine.  XR Cervical Spine 2 or 3 views  Result Date: 01/16/2022 Anterior osteophytes were noted.  Mild facet joint arthropathy was noted.  No significant disc space narrowing was noted.  No syndesmophytes were noted.  These findings are consistent with mild spondylosis and mild facet joint arthropathy.   Recent Labs: Lab Results  Component Value Date   WBC 7.2 01/16/2022   HGB 15.2 01/16/2022   PLT 220 01/16/2022   NA 139 01/16/2022   K 4.4 01/16/2022   CL 103 01/16/2022   CO2 27 01/16/2022   GLUCOSE 87 01/16/2022   BUN 17 01/16/2022   CREATININE 1.12 01/16/2022   BILITOT 0.4 01/16/2022   AST 12 01/16/2022   ALT 16 01/16/2022   PROT 7.7 01/16/2022   CALCIUM 9.4 01/16/2022   GFRAA >60 06/08/2019   01/16/2022 ESR 11  05/13/18:HLA-B27+, ANA negative, CRP 6, RF<10, CPK 117, Lyme-, ESR 5  Speciality Comments: No specialty comments available.  Procedures:  No procedures performed Allergies: Alpha-gal   Assessment / Plan:     Visit Diagnoses: Chronic back pain, unspecified back location, unspecified back pain laterality - History of lower back pain since in his early 27s, good response to prednisone.  Complains of severe pain and nocturnal pain.  Celebrex was prescribed at the last visit which she discontinued due to GI side effects.  ESR 11.  Lab values were discussed with the patient.  X-rays from 2017 showed mild degenerative changes and MRI showed degenerative changes.  HLA B27 (HLA B27 positive) -  May 13, 2018 HLA-B27 positive  DDD (degenerative disc disease), cervical - X-rays were consistent with multilevel spondylosis and facet joint arthropathy.  X-ray findings were reviewed with the patient.  He had limited painful range of motion of the cervical spine.  DDD (degenerative disc disease), thoracic - Multilevel spondylosis with unilateral large osteophytes were noted.  He had limited painful range of motion of the thoracic spine.  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  He does not have typical syndesmophytes.  Had a detailed discussion with the patient.  He wants to try another NSAID.  After discussing indications side effects contraindications I placed him on nabumetone 500 mg p.o. twice daily.  Side effects were discussed.  He was advised to take nabumetone with meals.  Increased risk of GI bleed with combined use of nabumetone and Celexa was also discussed.  I advised him to contact me if he develops any GI symptoms.  I also had a detailed discussion regarding the possibility of ankylosing spondylitis and a trial of Biologics in the future.  At this point he would like to hold off.  I advised him to come in 2 months for CBC with differential and CMP with GFR.  DDD (degenerative disc disease), lumbar - He complains of chronic back pain.  He had limited mobility.  He had x-rays and MRI previously consistent with degenerative disc disease and facet joint arthropathy.  Patient does not want to try physical therapy as it has caused increased pain in the past.  I offered referral to pain management but he declined.  Chronic right SI joint pain - History of right SI joint fusion.  X-ray showed SI joint sclerosis and narrowing.  Primary osteoarthritis of both hands - Clinical  findings were consistent with 30s.  No synovitis was noted.  Joint protection muscle strengthening was discussed.  Other fatigue-due to insomnia.  Insomnia due to medical condition - Due to  nocturnal pain.  Other medical problems are listed as follows:  History of hyperlipidemia  Seasonal allergies  History of anxiety  Allergy to alpha-gal  Family history of rheumatoid arthritis-father.  Patient states that he spoke with his mother who  told him that his father had rheumatoid arthritis and not Crohn's disease.  Orders: Orders Placed This Encounter  Procedures   CBC with Differential/Platelet   COMPLETE METABOLIC PANEL WITH GFR   Meds ordered this encounter  Medications   nabumetone (RELAFEN) 500 MG tablet    Sig: Take 1 tablet (500 mg total) by mouth 2 (two) times daily as needed.    Dispense:  60 tablet    Refill:  2    Face-to-face time spent with patient was 31 minutes. Greater than 50% of time was spent in counseling and coordination of care.  Follow-Up Instructions: Return in about 3 months (around 05/16/2022) for Osteoarthritis.   Bo Merino, MD  Note - This record has been created using Editor, commissioning.  Chart creation errors have been sought, but may not always  have been located. Such creation errors do not reflect on  the standard of medical care.

## 2022-02-14 ENCOUNTER — Encounter: Payer: Self-pay | Admitting: Rheumatology

## 2022-02-14 ENCOUNTER — Ambulatory Visit: Payer: BC Managed Care – PPO | Attending: Rheumatology | Admitting: Rheumatology

## 2022-02-14 VITALS — BP 128/77 | HR 71 | Resp 16 | Ht 73.0 in | Wt 227.8 lb

## 2022-02-14 DIAGNOSIS — Z1589 Genetic susceptibility to other disease: Secondary | ICD-10-CM | POA: Diagnosis not present

## 2022-02-14 DIAGNOSIS — Z91018 Allergy to other foods: Secondary | ICD-10-CM

## 2022-02-14 DIAGNOSIS — M5136 Other intervertebral disc degeneration, lumbar region: Secondary | ICD-10-CM

## 2022-02-14 DIAGNOSIS — M5134 Other intervertebral disc degeneration, thoracic region: Secondary | ICD-10-CM

## 2022-02-14 DIAGNOSIS — M51369 Other intervertebral disc degeneration, lumbar region without mention of lumbar back pain or lower extremity pain: Secondary | ICD-10-CM

## 2022-02-14 DIAGNOSIS — R5383 Other fatigue: Secondary | ICD-10-CM

## 2022-02-14 DIAGNOSIS — G8929 Other chronic pain: Secondary | ICD-10-CM

## 2022-02-14 DIAGNOSIS — M533 Sacrococcygeal disorders, not elsewhere classified: Secondary | ICD-10-CM

## 2022-02-14 DIAGNOSIS — Z5181 Encounter for therapeutic drug level monitoring: Secondary | ICD-10-CM

## 2022-02-14 DIAGNOSIS — M549 Dorsalgia, unspecified: Secondary | ICD-10-CM | POA: Diagnosis not present

## 2022-02-14 DIAGNOSIS — M5030 Other cervical disc degeneration, unspecified cervical region: Secondary | ICD-10-CM | POA: Diagnosis not present

## 2022-02-14 DIAGNOSIS — M19042 Primary osteoarthritis, left hand: Secondary | ICD-10-CM

## 2022-02-14 DIAGNOSIS — M19041 Primary osteoarthritis, right hand: Secondary | ICD-10-CM | POA: Diagnosis not present

## 2022-02-14 DIAGNOSIS — J302 Other seasonal allergic rhinitis: Secondary | ICD-10-CM

## 2022-02-14 DIAGNOSIS — Z8261 Family history of arthritis: Secondary | ICD-10-CM

## 2022-02-14 DIAGNOSIS — G4701 Insomnia due to medical condition: Secondary | ICD-10-CM

## 2022-02-14 DIAGNOSIS — Z8379 Family history of other diseases of the digestive system: Secondary | ICD-10-CM | POA: Insufficient documentation

## 2022-02-14 DIAGNOSIS — Z8659 Personal history of other mental and behavioral disorders: Secondary | ICD-10-CM

## 2022-02-14 DIAGNOSIS — Z8639 Personal history of other endocrine, nutritional and metabolic disease: Secondary | ICD-10-CM | POA: Insufficient documentation

## 2022-02-14 MED ORDER — NABUMETONE 500 MG PO TABS: 500.0000 mg | ORAL_TABLET | Freq: Two times a day (BID) | ORAL | 2 refills | Status: AC | PRN

## 2022-02-14 NOTE — Patient Instructions (Signed)
Return in 2 months to get CBC with differential and CMP with GFR

## 2022-04-06 ENCOUNTER — Encounter: Payer: Self-pay | Admitting: Rheumatology

## 2022-04-06 DIAGNOSIS — G8929 Other chronic pain: Secondary | ICD-10-CM

## 2022-04-06 DIAGNOSIS — M5134 Other intervertebral disc degeneration, thoracic region: Secondary | ICD-10-CM

## 2022-04-06 DIAGNOSIS — M5136 Other intervertebral disc degeneration, lumbar region: Secondary | ICD-10-CM

## 2022-04-06 DIAGNOSIS — M5030 Other cervical disc degeneration, unspecified cervical region: Secondary | ICD-10-CM

## 2022-04-08 NOTE — Telephone Encounter (Signed)
I would recommend referral to pain management if patient is in agreement.

## 2022-04-19 ENCOUNTER — Encounter: Payer: Self-pay | Admitting: Physical Medicine & Rehabilitation

## 2022-05-14 ENCOUNTER — Ambulatory Visit: Payer: BC Managed Care – PPO | Admitting: Rheumatology

## 2022-05-20 ENCOUNTER — Encounter
Payer: BC Managed Care – PPO | Attending: Physical Medicine & Rehabilitation | Admitting: Physical Medicine & Rehabilitation

## 2022-05-20 ENCOUNTER — Encounter: Payer: Self-pay | Admitting: Physical Medicine & Rehabilitation

## 2022-05-20 VITALS — BP 107/73 | HR 83 | Ht 73.0 in | Wt 226.0 lb

## 2022-05-20 DIAGNOSIS — Z5181 Encounter for therapeutic drug level monitoring: Secondary | ICD-10-CM | POA: Insufficient documentation

## 2022-05-20 DIAGNOSIS — G8929 Other chronic pain: Secondary | ICD-10-CM | POA: Diagnosis present

## 2022-05-20 DIAGNOSIS — M545 Low back pain, unspecified: Secondary | ICD-10-CM | POA: Diagnosis not present

## 2022-05-20 DIAGNOSIS — Z79891 Long term (current) use of opiate analgesic: Secondary | ICD-10-CM | POA: Diagnosis not present

## 2022-05-20 DIAGNOSIS — G894 Chronic pain syndrome: Secondary | ICD-10-CM | POA: Insufficient documentation

## 2022-05-20 NOTE — Progress Notes (Addendum)
Subjective:    Patient ID: Steven Santos, male    DOB: 01/09/1971, 51 y.o.   MRN: [id number]  HPI Steven Santos is a 51 y.o. year old male  who  has a past medical history of Allergic reaction to alpha-gal, Anxiety, Asthma, GERD (gastroesophageal reflux disease), Hyperlipidemia, and Stress.   They are presenting to PM&R clinic as a new patient for pain management evaluation. They were referred for treatment of chronic back pain.  Steven Santos has chronic thoracic and lower cervical pain that has been going on for many years.  He reports that he feels like his back is very stiff and has poor mobility.  Pain has been gradually worsening over time.  He has a history of prior lumbar and right SI joint pain.  He previously had lumbar epidural injections and right SI joint fusion and the pain in this area has improved.  He is followed by rheumatology and is currently seeing Dr. Corliss Skains.  Rheumatology is considering--and seronegative spondyloarthropathy specifically ankylosing spondylitis as possible diagnoses.  Rheumatology discussed considering biologic medication however patient decided to hold off at this time.  Activity improves the pain, sitting and laying down worsens it.  He denies any shooting pain at this time.  He reports that his mood is good overall.  He is currently on an antidepressant medication.   Red flag symptoms: Patient denies saddle anesthesia, loss of bowel or bladder continence, new weakness, new numbness/tingling, or pain waking up at nighttime.  Medications tried: Meloxicam- helped-hand itch swelling Celebrex- stomach irritaiton Relafen didn't help Ibuprofen and tylenol every day- helps pain   Other treatments: PT/OT - home exercies made it worse Epidulral L 5 prior pain in lumbar spine years ago, this has improved SI joint fusion Right- helped with pain in his area        Pain Inventory Average Pain 6 Pain Right Now 6 My pain is constant and .  In  the last 24 hours, has pain interfered with the following? General activity 5 Relation with others 4 Enjoyment of life 5 What TIME of day is your pain at its worst? varies Sleep (in general) Fair  Pain is worse with: bending, sitting, inactivity, and some activites Pain improves with:  . Relief from Meds:  no meds  walk without assistance how many minutes can you walk? 30 ability to climb steps?  yes do you drive?  yes  employed # of hrs/week .  No problems in this area  Any changes since last visit?  no  Any changes since last visit?  no    Family History  Problem Relation Age of Onset   Allergic rhinitis Father    Rheum arthritis Father    Crohn's disease Father    Healthy Sister    Healthy Son    Healthy Daughter    Angioedema Neg Hx    Asthma Neg Hx    Eczema Neg Hx    Immunodeficiency Neg Hx    Urticaria Neg Hx    Social History   Socioeconomic History   Marital status: Married    Spouse name: Not on file   Number of children: Not on file   Years of education: Not on file   Highest education level: Not on file  Occupational History   Not on file  Tobacco Use   Smoking status: Former    Passive exposure: Never   Smokeless tobacco: Former  Building services engineer Use: Never used  Substance and Sexual Activity   Alcohol use: Yes    Comment: occasional   Drug use: No   Sexual activity: Not on file  Other Topics Concern   Not on file  Social History Narrative   Not on file   Social Determinants of Health   Financial Resource Strain: Not on file  Food Insecurity: Not on file  Transportation Needs: Not on file  Physical Activity: Not on file  Stress: Not on file  Social Connections: Not on file   Past Surgical History:  Procedure Laterality Date   BACK SURGERY  01/08/2019   NO PAST SURGERIES     SACROILIAC JOINT FUSION Right 06/09/2019   Procedure: Removal and reposition of lower right sacroiliac screw;  Surgeon: Venita Lick, MD;  Location:  [facility];  Service: Orthopedics;  Laterality: Right;  45 mins   Past Medical History:  Diagnosis Date   Allergic reaction to alpha-gal    Anxiety    Asthma    GERD (gastroesophageal reflux disease)    Hyperlipidemia    Stress    BP 107/73   Pulse 83   Ht 6\' 1"  (1.854 m)   Wt 226 lb (102.5 kg)   SpO2 92%   BMI 29.82 kg/m   Opioid Risk Score:   Fall Risk Score:  `1  Depression screen [facility] [place] 2/9     05/20/2022    2:32 PM  Depression screen [facility] 2/9  Decreased Interest 0  Down, Depressed, Hopeless 0  PHQ - 2 Score 0  Altered sleeping 0  Tired, decreased energy 0  Change in appetite 0  Feeling bad or failure about yourself  0  Trouble concentrating 0  Moving slowly or fidgety/restless 0  Suicidal thoughts 0  PHQ-9 Score 0      Review of Systems  Musculoskeletal:  Positive for back pain.  All other systems reviewed and are negative.     Objective:   Physical Exam  Gen: no distress, normal appearing HEENT: oral mucosa pink and moist, NCAT Cardio: Reg rate Chest: normal effort, normal rate of breathing Abd: soft, non-distended Ext: no edema Psych: pleasant, normal affect Skin: intact Neuro: Alert, follows commands,  CN 2-12 grossly intact Strength 5/5 in all 5 extremities Sensation intact to LT in all 4 extremities Musculoskeletal: No joint swelling or tenderness T spine and lower C spine paraspinal tenderness Schober test positive 15--->17cm SLR neg b/l No SI tenderness Facet loading neg  Xray  T spine 01/16/22 No significant disc space narrowing was noted.  Anterior and lateral  osteophytes were noted.   Impression: These findings are consistent with degenerative disc disease  of thoracic spine.   X ray C spine 01/16/22 Anterior osteophytes were noted.  Mild facet joint arthropathy was noted.   No significant disc space narrowing was noted.  No syndesmophytes were  noted.   These findings are consistent with mild spondylosis and mild facet joint   arthropathy.    Xray Pelvis 01/16/22 Postsurgical changes and right SI joint fusion was noted.  Left SI joint  was not well-visualized.  Some sclerosis and narrowing was noted.  No hip  joint narrowing was noted.   Impression: These findings are consistent with SI joint sclerosis and  narrowing.  Postsurgical right SI joint fusion was noted.   L spine MRI 12/05/20  IMPRESSION: Mild thoracolumbar kyphotic curvature.   At L3-4, there is disc degeneration with annular fissures and annular bulging. Mild facet hypertrophy. Mild narrowing of the lateral recesses  but no distinct neural compression. Very similar appearance to the study of 2015.   Lesser, non-compressive abnormalities at the other levels as described above.   Previous sacroiliac fusion procedure on the right, not primarily or completely evaluated    Assessment & Plan:   Chronic back pain at T spine and neck pain -Follows with rheumatology- possible DISH vs AS (B27 Pos) -Continue Ibuprofen, can consider change to different NSAID at later time if pain not improved however he has already tried several at this time -UDS today, pain agreement -Consider tramadol low dose after UDS complete -Duloxetine may be option at later time- would need to stop current SSRI first  12/22 Called pt to f/u on UDS and plan, no answer, left discrete VM  12/26 Called pt, tramadol 50mg  q12h ordered #60.

## 2022-05-23 LAB — TOXASSURE SELECT,+ANTIDEPR,UR

## 2022-05-30 ENCOUNTER — Telehealth: Payer: Self-pay | Admitting: *Deleted

## 2022-05-30 NOTE — Telephone Encounter (Signed)
Urine drug screen for this encounter is consistent for prescribed medication 

## 2022-05-31 ENCOUNTER — Telehealth (HOSPITAL_COMMUNITY): Payer: Self-pay | Admitting: Physical Medicine & Rehabilitation

## 2022-06-04 ENCOUNTER — Telehealth: Payer: Self-pay

## 2022-06-04 MED ORDER — TRAMADOL HCL 50 MG PO TABS: 50.0000 mg | ORAL_TABLET | Freq: Two times a day (BID) | ORAL | 0 refills | Status: AC | PRN

## 2022-06-04 NOTE — Addendum Note (Signed)
Addended by: Fanny Dance on: 06/04/2022 04:36 PM   Modules accepted: Orders

## 2022-06-04 NOTE — Telephone Encounter (Signed)
Patient called returning call. Please call back at [phone]. Patient said he signed the opioid agreement and consent form and did UDS. [facility]

## 2022-06-06 ENCOUNTER — Encounter: Payer: Self-pay | Admitting: Physical Medicine & Rehabilitation

## 2022-06-12 ENCOUNTER — Telehealth: Payer: Self-pay

## 2022-06-12 NOTE — Telephone Encounter (Signed)
PA Approved for Tramadol valid 06/12/22-12/11/22 PA# [id number]

## 2022-06-14 ENCOUNTER — Other Ambulatory Visit: Payer: Self-pay | Admitting: Rehabilitation

## 2022-06-14 DIAGNOSIS — M546 Pain in thoracic spine: Secondary | ICD-10-CM

## 2022-06-24 ENCOUNTER — Encounter: Payer: BC Managed Care – PPO | Admitting: Physical Medicine & Rehabilitation

## 2022-07-12 ENCOUNTER — Other Ambulatory Visit: Payer: BC Managed Care – PPO

## 2022-08-12 ENCOUNTER — Ambulatory Visit
Admission: RE | Admit: 2022-08-12 | Discharge: 2022-08-12 | Disposition: A | Discharge status: Home / Self Care | Payer: BC Managed Care – PPO | Source: Ambulatory Visit | Attending: Rehabilitation | Admitting: Rehabilitation

## 2022-08-12 DIAGNOSIS — M546 Pain in thoracic spine: Secondary | ICD-10-CM

## 2022-11-30 ENCOUNTER — Emergency Department (HOSPITAL_BASED_OUTPATIENT_CLINIC_OR_DEPARTMENT_OTHER): Payer: 59

## 2022-11-30 ENCOUNTER — Emergency Department (HOSPITAL_BASED_OUTPATIENT_CLINIC_OR_DEPARTMENT_OTHER)
Admission: EM | Admit: 2022-11-30 | Discharge: 2022-12-01 | Disposition: A | Discharge status: Home / Self Care | Payer: 59 | Attending: Emergency Medicine | Admitting: Emergency Medicine

## 2022-11-30 ENCOUNTER — Other Ambulatory Visit: Payer: Self-pay

## 2022-11-30 ENCOUNTER — Encounter (HOSPITAL_BASED_OUTPATIENT_CLINIC_OR_DEPARTMENT_OTHER): Payer: Self-pay | Admitting: Emergency Medicine

## 2022-11-30 DIAGNOSIS — Y92410 Unspecified street and highway as the place of occurrence of the external cause: Secondary | ICD-10-CM | POA: Diagnosis not present

## 2022-11-30 DIAGNOSIS — M542 Cervicalgia: Secondary | ICD-10-CM | POA: Diagnosis present

## 2022-11-30 DIAGNOSIS — S0990XA Unspecified injury of head, initial encounter: Secondary | ICD-10-CM | POA: Diagnosis not present

## 2022-11-30 DIAGNOSIS — S161XXA Strain of muscle, fascia and tendon at neck level, initial encounter: Secondary | ICD-10-CM

## 2022-11-30 MED ORDER — OXYCODONE-ACETAMINOPHEN 5-325 MG PO TABS
2.0000 | ORAL_TABLET | Freq: Once | ORAL | Status: DC
  Filled 2022-11-30: qty 2

## 2022-11-30 MED ORDER — CYCLOBENZAPRINE HCL 10 MG PO TABS: 10.0000 mg | ORAL_TABLET | Freq: Three times a day (TID) | ORAL | 0 refills | Status: AC | PRN

## 2022-11-30 MED ORDER — OXYCODONE HCL 5 MG PO TABS: 5.0000 mg | ORAL_TABLET | Freq: Four times a day (QID) | ORAL | 0 refills | Status: AC | PRN

## 2022-11-30 MED ORDER — LIDOCAINE 5 % EX PTCH: 1.0000 | MEDICATED_PATCH | CUTANEOUS | 0 refills | Status: AC
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
## 2022-11-30 NOTE — Discharge Instructions (Addendum)
Thank you for letting us take care of you today.  The scan of your neck was normal. I sent in a muscle relaxer for you to take to help with your pain as you will likely be more sore over the next few days compared to today. If you take the one I prescribed, do not take any other muscle relaxers prescribed by your outpatient team.  I sent in a small amount of pain medication as well. Please use other medications to manage your pain as first line and only take this as needed for breakthrough, severe pain.  I provided some exercises you can do at home to help rehabilitate your neck as well. You may do these as tolerated. It is important to stay active to prevent further tightening of your muscles which can prolong and worsen your pain.  For any new or worsening symptoms or new injury, return to nearest ED for re-evaluation.
